# Patient Record
Sex: Male | Born: 1955 | Race: Black or African American | Hispanic: No | Marital: Married | Smoking: Never smoker
Health system: Southern US, Community
[De-identification: ages and names within clinical notes are randomized; demographics above are authoritative.]

## PROBLEM LIST (undated history)

## (undated) DIAGNOSIS — E119 Type 2 diabetes mellitus without complications: Secondary | ICD-10-CM

## (undated) DIAGNOSIS — N189 Chronic kidney disease, unspecified: Secondary | ICD-10-CM

## (undated) DIAGNOSIS — I10 Essential (primary) hypertension: Secondary | ICD-10-CM

## (undated) HISTORY — PX: REPLACEMENT TOTAL HIP W/  RESURFACING IMPLANTS: SUR1222

## (undated) HISTORY — PX: OSTEOTOMY TOES: SUR996

---

## 2015-02-08 ENCOUNTER — Telehealth: Payer: Self-pay | Admitting: *Deleted

## 2015-02-08 ENCOUNTER — Ambulatory Visit (INDEPENDENT_AMBULATORY_CARE_PROVIDER_SITE_OTHER): Payer: BLUE CROSS/BLUE SHIELD

## 2015-02-08 VITALS — BP 133/94 | HR 83 | Resp 16

## 2015-02-08 DIAGNOSIS — M158 Other polyosteoarthritis: Secondary | ICD-10-CM | POA: Diagnosis not present

## 2015-02-08 DIAGNOSIS — Q828 Other specified congenital malformations of skin: Secondary | ICD-10-CM

## 2015-02-08 DIAGNOSIS — M79673 Pain in unspecified foot: Secondary | ICD-10-CM

## 2015-02-08 DIAGNOSIS — M2012 Hallux valgus (acquired), left foot: Secondary | ICD-10-CM

## 2015-02-08 DIAGNOSIS — M7750 Other enthesopathy of unspecified foot: Secondary | ICD-10-CM | POA: Diagnosis not present

## 2015-02-08 DIAGNOSIS — M21612 Bunion of left foot: Secondary | ICD-10-CM

## 2015-02-08 DIAGNOSIS — M779 Enthesopathy, unspecified: Secondary | ICD-10-CM

## 2015-02-08 DIAGNOSIS — M2010 Hallux valgus (acquired), unspecified foot: Secondary | ICD-10-CM

## 2015-02-08 DIAGNOSIS — M2011 Hallux valgus (acquired), right foot: Secondary | ICD-10-CM

## 2015-02-08 DIAGNOSIS — M21611 Bunion of right foot: Secondary | ICD-10-CM

## 2015-02-08 DIAGNOSIS — M778 Other enthesopathies, not elsewhere classified: Secondary | ICD-10-CM

## 2015-02-08 NOTE — Patient Instructions (Addendum)
Pre-Operative Instructions  Congratulations, you have decided to take an important step to improving your quality of life.  You can be assured that the doctors of [facility] will be with you every step of the way.  1. Plan to be at the surgery center/hospital at least 1 (one) hour prior to your scheduled time unless otherwise directed by the surgical center/hospital staff.  You must have a responsible adult accompany you, remain during the surgery and drive you home.  Make sure you have directions to the surgical center/hospital and know how to get there on time. 2. For hospital based surgery you will need to obtain a history and physical form from your family physician within 1 month prior to the date of surgery- we will give you a form for you primary physician.  3. We make every effort to accommodate the date you request for surgery.  There are however, times where surgery dates or times have to be moved.  We will contact you as soon as possible if a change in schedule is required.   4. No Aspirin/Ibuprofen for one week before surgery.  If you are on aspirin, any non-steroidal anti-inflammatory medications (Mobic, Aleve, Ibuprofen) you should stop taking it 7 days prior to your surgery.  You make take Tylenol  For pain prior to surgery.  5. Medications- If you are taking daily heart and blood pressure medications, seizure, reflux, allergy, asthma, anxiety, pain or diabetes medications, make sure the surgery center/hospital is aware before the day of surgery so they may notify you which medications to take or avoid the day of surgery. 6. No food or drink after midnight the night before surgery unless directed otherwise by surgical center/hospital staff. 7. No alcoholic beverages 24 hours prior to surgery.  No smoking 24 hours prior to or 24 hours after surgery. 8. Wear loose pants or shorts- loose enough to fit over bandages, boots, and casts. 9. No slip on shoes, sneakers are best. 10. Bring  your boot with you to the surgery center/hospital.  Also bring crutches or a walker if your physician has prescribed it for you.  If you do not have this equipment, it will be provided for you after surgery. 11. If you have not been contracted by the surgery center/hospital by the day before your surgery, call to confirm the date and time of your surgery. 12. Leave-time from work may vary depending on the type of surgery you have.  Appropriate arrangements should be made prior to surgery with your employer. 13. Prescriptions will be provided immediately following surgery by your doctor.  Have these filled as soon as possible after surgery and take the medication as directed. 14. Remove nail polish on the operative foot. 15. Wash the night before surgery.  The night before surgery wash the foot and leg well with the antibacterial soap provided and water paying special attention to beneath the toenails and in between the toes.  Rinse thoroughly with water and dry well with a towel.  Perform this wash unless told not to do so by your physician.  Enclosed: 1 Ice pack (please put in freezer the night before surgery)   1 Hibiclens skin cleaner   Pre-op Instructions  If you have any questions regarding the instructions, do not hesitate to call our office.  [place]: [address] [place], [place] [address] [phone]  [place]: [address] , [place] [address] [phone]  [place]: [address]  [place], [place] [address] [phone]  Dr. Xylia Scherger   Tuchman DPM, Dr. Norman Regal DPM Dr. Osten Janek DPM, Dr. M. Todd Hyatt DPM, Dr. Kathryn Egerton DPMPre-Operative Instructions  Congratulations, you have decided to take an important step to improving your quality of life.  You can be assured that the doctors of [facility] will be with you every step of the way.  16. Plan to be at the surgery center/hospital at least 1 (one) hour prior to your scheduled time unless otherwise directed  by the surgical center/hospital staff.  You must have a responsible adult accompany you, remain during the surgery and drive you home.  Make sure you have directions to the surgical center/hospital and know how to get there on time. 17. For hospital based surgery you will need to obtain a history and physical form from your family physician within 1 month prior to the date of surgery- we will give you a form for you primary physician.  18. We make every effort to accommodate the date you request for surgery.  There are however, times where surgery dates or times have to be moved.  We will contact you as soon as possible if a change in schedule is required.   19. No Aspirin/Ibuprofen for one week before surgery.  If you are on aspirin, any non-steroidal anti-inflammatory medications (Mobic, Aleve, Ibuprofen) you should stop taking it 7 days prior to your surgery.  You make take Tylenol  For pain prior to surgery.  20. Medications- If you are taking daily heart and blood pressure medications, seizure, reflux, allergy, asthma, anxiety, pain or diabetes medications, make sure the surgery center/hospital is aware before the day of surgery so they may notify you which medications to take or avoid the day of surgery. 21. No food or drink after midnight the night before surgery unless directed otherwise by surgical center/hospital staff. 22. No alcoholic beverages 24 hours prior to surgery.  No smoking 24 hours prior to or 24 hours after surgery. 23. Wear loose pants or shorts- loose enough to fit over bandages, boots, and casts. 24. No slip on shoes, sneakers are best. 25. Bring your boot with you to the surgery center/hospital.  Also bring crutches or a walker if your physician has prescribed it for you.  If you do not have this equipment, it will be provided for you after surgery. 26. If you have not been contracted by the surgery center/hospital by the day before your surgery, call to confirm the date and time  of your surgery. 27. Leave-time from work may vary depending on the type of surgery you have.  Appropriate arrangements should be made prior to surgery with your employer. 28. Prescriptions will be provided immediately following surgery by your doctor.  Have these filled as soon as possible after surgery and take the medication as directed. 29. Remove nail polish on the operative foot. 30. Wash the night before surgery.  The night before surgery wash the foot and leg well with the antibacterial soap provided and water paying special attention to beneath the toenails and in between the toes.  Rinse thoroughly with water and dry well with a towel.  Perform this wash unless told not to do so by your physician.  Enclosed: 1 Ice pack (please put in freezer the night before surgery)   1 Hibiclens skin cleaner   Pre-op Instructions  If you have any questions regarding the instructions, do not hesitate to call our office.  [place]: [address] [place], [place] [address] [phone]  [place]: [address] [place], [place] [address] [phone]  [place]: [address]  [place], [place] [address] [phone]  Dr. Silveria Botz Tuchman DPM, Dr. Norman Regal DPM Dr. Jamile Sivils DPM, Dr. M. Todd Hyatt DPM, Dr. Kathryn Egerton DPM 

## 2015-02-08 NOTE — Progress Notes (Signed)
   Subjective:    Patient ID: Michael Burnett, male    DOB: January 20, 1956, 59 y.o.   MRN: [id number]  HPI Pt presents with bilateral bunion deformities and painful bilateral calluses   Review of Systems  Respiratory: Positive for cough.   Gastrointestinal: Positive for abdominal distention.  Musculoskeletal: Positive for back pain, arthralgias and gait problem.  Neurological: Positive for dizziness and headaches.       Objective:   Physical Exam 59 year old of current male presents as referral from Dr. Elijah Birkom. Complaining of painful bunions and keratoses sub-third MTP area bilateral left foot more so than right. Has some stiffness in the great toe joint has been treated in the past for gouty arthropathy. Growing taking his and his indomethacin on an as-needed basis. Objective findings revealed pedal pulses palpable DP and PT +2 over 4 bilateral capillary refill time 3 seconds all digits of epicritic and proprioceptive sensations intact and symmetric bilateral there is normal plantar response and DTRs levels and watching skin color pigment normal hair growth absent there is keratoses sub-third MTP area bilateral there is flexible digital contractures lesser digits 234 and 5 with adductovarus fifth digit, there is some elevation of the third and fourth digits at the MTP joint with some dorsal capsular contracture at the third MTP joint in particular bilateral. There is limited range of motion hallux rigidus great toe joint with asymmetric joint space narrowing noted clinically and radiographically at the first MTP joint within his around 30 there 25-30 dorsiflexion 510 plantarflexion bilateral left more painful symptomatically right. Position 3 there is a very.IM angle greater than 12 hallux abductus greater than 20       Assessment & Plan:  Assessment this time is #1 hallux abductovalgus with bunion deformity and hallux rigidus first MTP joint bilateral capsulitis is also capsulitis with  plantarflexed third metatarsals bilateral. With assisted capsulitis of the MTP joint and oral keratoses sub-3 bilateral plan at this time discussed options patient had tried changing shoes pads cushioning anti-inflammatory medications TEMPORARY relief at this time after referral from Dr. Zachary Georgeomaszewski patient is ready for surgical intervention at this time [facility] with cheilectomy and decompression first MTP area left foot is reviewed also a decompression or shortening osteotomy third metatarsal left with capsulotomy of the MTP joint is also reviewed the consent forms were reviewed and signed risk of case alternatives were reviewed all questions by the patient answered this time surgery scheduled at his convenience with appropriate follow-up thereafter will be in an air fracture boot for at least a 5-6 week. At 3 months will be related to contralateral foot per patient request. At this time consent form reviewed and signed and surgery scheduled per patient request  Alvan Dameichard Spero Gunnels DPM

## 2015-02-08 NOTE — Telephone Encounter (Signed)
I called patient to see if we could reschedule his surgery to 02/22/2015 from 02/15/2015.  "I'd rather not, I have a ride for 02/15/2015.  It'll be hard to get it done the following week."  Okay, we'll leave it for 02/15/2015.

## 2015-02-15 DIAGNOSIS — M21542 Acquired clubfoot, left foot: Secondary | ICD-10-CM | POA: Diagnosis not present

## 2015-02-15 DIAGNOSIS — M2012 Hallux valgus (acquired), left foot: Secondary | ICD-10-CM | POA: Diagnosis not present

## 2015-02-21 ENCOUNTER — Ambulatory Visit (INDEPENDENT_AMBULATORY_CARE_PROVIDER_SITE_OTHER): Payer: BLUE CROSS/BLUE SHIELD

## 2015-02-21 VITALS — BP 133/97 | HR 69 | Resp 12

## 2015-02-21 DIAGNOSIS — M2010 Hallux valgus (acquired), unspecified foot: Secondary | ICD-10-CM

## 2015-02-21 DIAGNOSIS — Z09 Encounter for follow-up examination after completed treatment for conditions other than malignant neoplasm: Secondary | ICD-10-CM

## 2015-02-21 DIAGNOSIS — M2012 Hallux valgus (acquired), left foot: Secondary | ICD-10-CM | POA: Diagnosis not present

## 2015-02-21 DIAGNOSIS — M79673 Pain in unspecified foot: Secondary | ICD-10-CM

## 2015-02-21 DIAGNOSIS — M1A9XX1 Chronic gout, unspecified, with tophus (tophi): Secondary | ICD-10-CM

## 2015-02-21 DIAGNOSIS — M158 Other polyosteoarthritis: Secondary | ICD-10-CM

## 2015-02-21 DIAGNOSIS — M109 Gout, unspecified: Secondary | ICD-10-CM

## 2015-02-21 NOTE — Progress Notes (Signed)
   Subjective:    Patient ID: Michael Burnett, male    DOB: 04-29-1956, 59 y.o.   MRN: [id number]  HPI patient doesn't history of arthritic left great toe joint surgery confirmed large tophaceous deposits from the MTP joint which were removed during surgery.  dos 02-15-2015 bunionectomy.  ''LT FOOT IS DOING OK.''  Review of Systems no new findings or systemic changes are noted    Objective:   Physical Exam  neurovascular status is intact pedal pulses are palpable epicritic and proprioceptive sensations intact and symmetric patient did have severe gouty arthropathy with multiple tophi that were expressed pathology did confirm that. X-rays this time reveal good position osteotomy intact K wire fixation the first and screw fixation of third metatarsal of the plantar flexed third metatarsals resolving well patient had minimal pain or discomfort. No dehiscence no discharge or drainage patient has approximately 30 dorsiflexion to 35 510 plantar flexion again there is some it was severely some limitation of motion of the hallux and large tophi identified within surgery.        Assessment & Plan:   assessment good postop progress is 6 days status post Austin bunionectomy with cheilectomy decompression first MTP area left as well as third met osteotomy. Patient is ambulating comfortably presto compressive dressing reapplied at this time patient is advised that active and passive range of motion exercises recheck in one week for further follow-up maintain air fracture boot at all times and do a least a Hunter toe pushups as instructed daily  Harriet Masson DPM

## 2015-02-28 ENCOUNTER — Ambulatory Visit (INDEPENDENT_AMBULATORY_CARE_PROVIDER_SITE_OTHER): Payer: BLUE CROSS/BLUE SHIELD | Admitting: Podiatry

## 2015-02-28 ENCOUNTER — Encounter: Payer: Self-pay | Admitting: Podiatry

## 2015-02-28 VITALS — BP 121/80 | HR 66 | Resp 12

## 2015-02-28 DIAGNOSIS — M2012 Hallux valgus (acquired), left foot: Secondary | ICD-10-CM | POA: Diagnosis not present

## 2015-02-28 DIAGNOSIS — Z09 Encounter for follow-up examination after completed treatment for conditions other than malignant neoplasm: Secondary | ICD-10-CM

## 2015-02-28 DIAGNOSIS — M21612 Bunion of left foot: Secondary | ICD-10-CM

## 2015-03-01 NOTE — Progress Notes (Signed)
Subjective:     Patient ID: Michael Burnett, male   DOB: [birth year], 59 y.o.   MRN: [id number]  HPI patient presents stating my left foot is doing well but I do get some pain when I try to walk or try to be active on it   Review of Systems     Objective:   Physical Exam Neurovascular status intact muscle strength was adequate with range of motion that is adequate of the first MPJ with approximate 20 dorsiflexion 15 of plantarflexion with no crepitus currently noted. Wound edges are found to be well coapted and I'm and is noted    Assessment:     Recovering well from hallux limitus procedure left and neuroma excision left with slight restriction of motion noted    Plan:     Reviewed condition and at this time dispensed a BioSkin to plantarflexed the left hallux and a nonweightbearing position to stretch the dorsal capsule. Gave instructions on range of motion exercises and also dispensed surgical shoe with instructions on usage. Reappoint to recheck

## 2015-03-13 ENCOUNTER — Ambulatory Visit (INDEPENDENT_AMBULATORY_CARE_PROVIDER_SITE_OTHER): Payer: BLUE CROSS/BLUE SHIELD

## 2015-03-13 ENCOUNTER — Ambulatory Visit (INDEPENDENT_AMBULATORY_CARE_PROVIDER_SITE_OTHER): Payer: BLUE CROSS/BLUE SHIELD | Admitting: Podiatry

## 2015-03-13 ENCOUNTER — Encounter: Payer: Self-pay | Admitting: Podiatry

## 2015-03-13 VITALS — BP 145/98 | HR 75 | Resp 18

## 2015-03-13 DIAGNOSIS — Z09 Encounter for follow-up examination after completed treatment for conditions other than malignant neoplasm: Secondary | ICD-10-CM

## 2015-03-13 DIAGNOSIS — M2012 Hallux valgus (acquired), left foot: Secondary | ICD-10-CM

## 2015-03-13 NOTE — Progress Notes (Signed)
Patient ID: Michael Burnett, male   DOB: [birth year], 59 y.o.   MRN: [id number]  Subjective: 59 year old male presents the office they status post left foot Austin bunionectomy and third metatarsal osteotomy. She does that overall he is doing well. Continued with wearing surgical shoe. He states his pain is controlled. Denies any systemic complaints as fevers, chills, nausea, vomiting. Denies any calf pain, chest pain, shortness of breath.  Objective: AAO 3, NAD DP/PT pulses palpable, CRT less than 3 seconds Protective sensation appears to be intact with Dorann OuSimms Weinstein monofilament Incisions are well coapted without any evidence of dehiscence and there is no swelling erythema, increase in warmth, drainage, or any other clinical signs of infection. There are some suture knots visible at the end of the incisions. There is mild overlying edema to the left forefoot however subjective the patient states that it has decreased. There is no associated erythema or increase in warmth. There is no specific tenderness along the site of the surgery. There is no pain with first MTPJ range of motion.No other areas of tenderness to bilateral lower extremities. No open lesions or pre-ulcer lesions identified. There is no pain with calf compression, swelling, warmth, erythema.  Assessment: 59 year old male 4 weeks status post left foot surgery, doing well  Plan: -X-rays were obtained and reviewed with the patient. -Treatment options were discussed including alternatives, risks, complications. -suture knots were removed without complications. -Continue surgical shoe. weightbearing as tolerated -Ice and elevation -pain meds as needed -Follow-up in 2 weeks for repeat x-rays. At that time will likely transition to a regular shoe. In the meantime occurs call the office with any questions, concerns, change in symptoms.

## 2015-03-29 ENCOUNTER — Ambulatory Visit (INDEPENDENT_AMBULATORY_CARE_PROVIDER_SITE_OTHER): Payer: BLUE CROSS/BLUE SHIELD | Admitting: Podiatry

## 2015-03-29 ENCOUNTER — Ambulatory Visit (INDEPENDENT_AMBULATORY_CARE_PROVIDER_SITE_OTHER): Payer: BLUE CROSS/BLUE SHIELD

## 2015-03-29 ENCOUNTER — Encounter: Payer: Self-pay | Admitting: Podiatry

## 2015-03-29 VITALS — BP 137/100 | HR 87 | Resp 18

## 2015-03-29 DIAGNOSIS — Z09 Encounter for follow-up examination after completed treatment for conditions other than malignant neoplasm: Secondary | ICD-10-CM

## 2015-03-29 DIAGNOSIS — M21612 Bunion of left foot: Secondary | ICD-10-CM

## 2015-03-29 DIAGNOSIS — M2012 Hallux valgus (acquired), left foot: Secondary | ICD-10-CM

## 2015-03-29 MED ORDER — HYDROCODONE-ACETAMINOPHEN 5-325 MG PO TABS: 1.0000 | ORAL_TABLET | Freq: Four times a day (QID) | ORAL | Status: DC | PRN

## 2015-03-31 ENCOUNTER — Encounter: Payer: Self-pay | Admitting: Podiatry

## 2015-03-31 NOTE — Progress Notes (Signed)
Patient ID: Michael Burnett, male   DOB: [birth year], 59 y.o.   MRN: [id number]  Subjective: Mr. Michael Burnett presents the office today status post left foot Austin bunionectomy and third metatarsal osteotomy. She does that overall he is doing well and has continued with wearing surgical shoe. He states his pain is controlled. He has run out of the vicodin and would like another prescription just in case of increased pain. Denies any systemic complaints as fevers, chills, nausea, vomiting. Denies any calf pain, chest pain, shortness of breath.  Objective: AAO 3, NAD DP/PT pulses palpable, CRT less than 3 seconds Protective sensation appears to be intact with Dorann OuSimms Weinstein monofilament Incisions are well coapted without any evidence of dehiscence and there is no swelling erythema, increase in warmth, drainage, or any other clinical signs of infection. There is decreased edema to the left forefoot. There is no associated erythema or increase in warmth. There is no specific tenderness along the site of the surgery. There is no pain with first MTPJ range of motion.No other areas of tenderness to bilateral lower extremities. No open lesions or pre-ulcer lesions identified. There is no pain with calf compression, swelling, warmth, erythema.  Assessment: 59 year old male 6 weeks status post left foot surgery, doing well  Plan: -X-rays were obtained and reviewed with the patient. Osteotomy site is present on the 1st metatarsal and on the 3rd metatarsal there is bone callus formation proximal to the osteotomy.  -Treatment options were discussed including alternatives, risks, complications. -Continue surgical shoe. Weightbearing as tolerated -Ice and elevation -Pain meds as needed. Refilled vicodin.  -Follow-up in 2 weeks for repeat x-rays.  In the meantime occurs call the office with any questions, concerns, change in symptoms.

## 2015-04-12 ENCOUNTER — Ambulatory Visit (INDEPENDENT_AMBULATORY_CARE_PROVIDER_SITE_OTHER): Payer: BLUE CROSS/BLUE SHIELD

## 2015-04-12 ENCOUNTER — Encounter: Payer: Self-pay | Admitting: Podiatry

## 2015-04-12 ENCOUNTER — Ambulatory Visit (INDEPENDENT_AMBULATORY_CARE_PROVIDER_SITE_OTHER): Payer: BLUE CROSS/BLUE SHIELD | Admitting: Podiatry

## 2015-04-12 VITALS — BP 153/97 | HR 74 | Resp 18

## 2015-04-12 DIAGNOSIS — M21612 Bunion of left foot: Secondary | ICD-10-CM

## 2015-04-12 DIAGNOSIS — M2012 Hallux valgus (acquired), left foot: Secondary | ICD-10-CM

## 2015-04-12 DIAGNOSIS — Z09 Encounter for follow-up examination after completed treatment for conditions other than malignant neoplasm: Secondary | ICD-10-CM

## 2015-04-16 ENCOUNTER — Encounter: Payer: Self-pay | Admitting: Podiatry

## 2015-04-16 NOTE — Progress Notes (Signed)
Patient ID: Michael Burnett, male   DOB: Apr 03, 1956, 59 y.o.   MRN: [id number]  Subjective: Michael Burnett presents the office today status post left foot Austin bunionectomy and third metatarsal osteotomy. He does that overall he is doing well and has continued with wearing surgical shoe. He doesn't he has some soreness to the surgical site as well as some swelling.  Denies any systemic complaints as fevers, chills, nausea, vomiting. Denies any calf pain, chest pain, shortness of breath.  Objective: AAO 3, NAD; presents wearing surgical shoe. DP/PT pulses palpable, CRT less than 3 seconds Protective sensation appears to be intact with Dorann OuSimms Weinstein monofilament Incisions are well coapted without any evidence of dehiscence and there is no swelling erythema, increase in warmth, drainage, or any other clinical signs of infection. There is mild edema to the left forefoot over on the side of the bunion surgery. There is no associated erythema or increase in warmth. There is mild discomfort along the first metatarsal distally along the surgical site. There is no tenderness along the third metatarsal. There is no pain with first MTPJ range of motion. No other areas of tenderness to bilateral lower extremities. No open lesions or pre-ulcer lesions identified. There is no pain with calf compression, swelling, warmth, erythema.  Assessment: 59 year old male 8 weeks status post left foot surgery with Dr. Ralene CorkSikora   Plan: -X-rays were obtained and reviewed with the patient. Osteotomy site is present on the 1st metatarsal and on the 3rd metatarsal there is bone callus formation proximal to the osteotomy.  -Treatment options were discussed including alternatives, risks, complications. -Ice and elevation -Due to the continued pain and swelling as well as evidence of callus formation on third metatarsal and there is still a radiolucent line evident in the first metatarsal I recommend continuing to wear the surgical shoe.  He can be weightbearing as tolerated. -Follow-up in 2 weeks for repeat x-rays.  In the meantime occurs call the office with any questions, concerns, change in symptoms.

## 2015-04-26 ENCOUNTER — Ambulatory Visit (INDEPENDENT_AMBULATORY_CARE_PROVIDER_SITE_OTHER): Payer: BLUE CROSS/BLUE SHIELD

## 2015-04-26 ENCOUNTER — Encounter: Payer: Self-pay | Admitting: Podiatry

## 2015-04-26 ENCOUNTER — Ambulatory Visit (INDEPENDENT_AMBULATORY_CARE_PROVIDER_SITE_OTHER): Payer: BLUE CROSS/BLUE SHIELD | Admitting: Podiatry

## 2015-04-26 VITALS — BP 137/90 | HR 80 | Resp 18

## 2015-04-26 DIAGNOSIS — M2012 Hallux valgus (acquired), left foot: Secondary | ICD-10-CM

## 2015-04-26 DIAGNOSIS — M21612 Bunion of left foot: Secondary | ICD-10-CM

## 2015-04-26 DIAGNOSIS — Z09 Encounter for follow-up examination after completed treatment for conditions other than malignant neoplasm: Secondary | ICD-10-CM

## 2015-04-30 ENCOUNTER — Encounter: Payer: Self-pay | Admitting: Podiatry

## 2015-04-30 NOTE — Progress Notes (Signed)
Patient ID: Michael Burnett, male   DOB: 07-09-1956, 59 y.o.   MRN: [id number]  Subjective: Michael Burnett presents the office today status post left foot Austin bunionectomy and third metatarsal osteotomy with Dr. Ralene CorkSikora. He does that overall he is doing well and has continued with wearing surgical shoe. He denies any pain, and overall states that he is doing good. Also he believes the swelling has decreased.  Denies any systemic complaints as fevers, chills, nausea, vomiting. Denies any calf pain, chest pain, shortness of breath.  Objective: AAO 3, NAD; presents wearing surgical shoe. DP/PT pulses palpable, CRT less than 3 seconds Protective sensation appears to be intact with Michael Burnett monofilament Incisions are well coapted without any evidence of dehiscence and a scar has formed. There is no surrounding erythema, increase in warmth, drainage, or any other clinical signs of infection. There is decreased edema to the left forefoot over on the side of the bunion surgery. There is no associated erythema or increase in warmth. There is no discomfort along the surgical sites. There is no pain with first MTPJ range of motion. No other areas of tenderness to bilateral lower extremities. No open lesions or pre-ulcer lesions identified. There is no pain with calf compression, swelling, warmth, erythema.  Assessment: 59 year old male 10 weeks status post left foot surgery with Dr. Ralene CorkSikora   Plan: -X-rays were obtained and reviewed with the patient.  -Treatment options were discussed including alternatives, risks, complications. -At this time he can return to regular, supportive, loosefitting sneaker as tolerated. If there is any increase in pain while transitioning to return to the surgical shoe and call the office immediately. -Ice and elevation -Follow-up in 4 weeks. At that time repeat x-rays will be done  In the meantime occurs call the office with any questions, concerns, change in symptoms. If  symptoms and improved the left foot healed like to schedule surgery on the right foot at that time as well.

## 2015-05-29 ENCOUNTER — Encounter: Payer: BLUE CROSS/BLUE SHIELD | Admitting: Podiatry

## 2015-06-19 ENCOUNTER — Ambulatory Visit: Payer: Self-pay

## 2015-06-19 ENCOUNTER — Ambulatory Visit (INDEPENDENT_AMBULATORY_CARE_PROVIDER_SITE_OTHER): Payer: BLUE CROSS/BLUE SHIELD | Admitting: Podiatry

## 2015-06-19 ENCOUNTER — Encounter: Payer: Self-pay | Admitting: Podiatry

## 2015-06-19 VITALS — BP 123/84 | HR 86 | Resp 15

## 2015-06-19 DIAGNOSIS — Z9889 Other specified postprocedural states: Secondary | ICD-10-CM

## 2015-06-19 DIAGNOSIS — M2012 Hallux valgus (acquired), left foot: Secondary | ICD-10-CM

## 2015-06-19 DIAGNOSIS — M216X1 Other acquired deformities of right foot: Secondary | ICD-10-CM

## 2015-06-19 DIAGNOSIS — M2011 Hallux valgus (acquired), right foot: Secondary | ICD-10-CM

## 2015-06-19 NOTE — Patient Instructions (Signed)
Pre-Operative Instructions  Congratulations, you have decided to take an important step to improving your quality of life.  You can be assured that the doctors of [facility] will be with you every step of the way.  1. Plan to be at the surgery center/hospital at least 1 (one) hour prior to your scheduled time unless otherwise directed by the surgical center/hospital staff.  You must have a responsible adult accompany you, remain during the surgery and drive you home.  Make sure you have directions to the surgical center/hospital and know how to get there on time. 2. For hospital based surgery you will need to obtain a history and physical form from your family physician within 1 month prior to the date of surgery- we will give you a form for you primary physician.  3. We make every effort to accommodate the date you request for surgery.  There are however, times where surgery dates or times have to be moved.  We will contact you as soon as possible if a change in schedule is required.   4. No Aspirin/Ibuprofen for one week before surgery.  If you are on aspirin, any non-steroidal anti-inflammatory medications (Mobic, Aleve, Ibuprofen) you should stop taking it 7 days prior to your surgery.  You make take Tylenol  For pain prior to surgery.  5. Medications- If you are taking daily heart and blood pressure medications, seizure, reflux, allergy, asthma, anxiety, pain or diabetes medications, make sure the surgery center/hospital is aware before the day of surgery so they may notify you which medications to take or avoid the day of surgery. 6. No food or drink after midnight the night before surgery unless directed otherwise by surgical center/hospital staff. 7. No alcoholic beverages 24 hours prior to surgery.  No smoking 24 hours prior to or 24 hours after surgery. 8. Wear loose pants or shorts- loose enough to fit over bandages, boots, and casts. 9. No slip on shoes, sneakers are best. 10. Bring  your boot with you to the surgery center/hospital.  Also bring crutches or a walker if your physician has prescribed it for you.  If you do not have this equipment, it will be provided for you after surgery. 11. If you have not been contracted by the surgery center/hospital by the day before your surgery, call to confirm the date and time of your surgery. 12. Leave-time from work may vary depending on the type of surgery you have.  Appropriate arrangements should be made prior to surgery with your employer. 13. Prescriptions will be provided immediately following surgery by your doctor.  Have these filled as soon as possible after surgery and take the medication as directed. 14. Remove nail polish on the operative foot. 15. Wash the night before surgery.  The night before surgery wash the foot and leg well with the antibacterial soap provided and water paying special attention to beneath the toenails and in between the toes.  Rinse thoroughly with water and dry well with a towel.  Perform this wash unless told not to do so by your physician.  Enclosed: 1 Ice pack (please put in freezer the night before surgery)   1 Hibiclens skin cleaner   Pre-op Instructions  If you have any questions regarding the instructions, do not hesitate to call our office.  [place]: [address] [place], [place] [address] [phone]  [place]: [address] [place], [place] [address] [phone]  [place]: [address]  [place], [place] [address] [phone]  Dr. Richard   Tuchman DPM, Dr. Norman Regal DPM Dr. Richard Sikora DPM, Dr. M. Todd Hyatt DPM, Dr. Kathryn Egerton DPM, Dr. Nyaisha Simao, DPM 

## 2015-06-20 NOTE — Progress Notes (Signed)
Patient ID: Michael Burnett, male   DOB: [birth year], 59 y.o.   MRN: [id number]  Subjective: 59 year old male presents the office a follow-up evaluation of left foot surgery after undergoing bunionectomy and third metatarsal osteotomy. He states that since last time he is doing well he is no pain along the surgical sites. He is able to and in a regular shoe without problems. This time he would schedule surgery for the right side. He states he continues have pain overlying a prominent bunion deformity right foot as well as having a painful callus submetatarsal 3. He is attended conservative treatment including but not limited to shoe gear modifications, padding, offloading, periodic debridements any resolution of symptoms. At this time is requesting surgical intervention on the right side to help decrease his pain and deformity. No other complaints at this time in no acute changes since last appointment.  Objective: AAO 3, NAD DP/PT pulses palpable bilaterally, CRT less than 3 seconds Protective sensation intact with Simms Weinstein monofilament Incision the left foot of both well coapted the scar over that area. There is no tenderness to palpation overlying the surgical sites. There is no overlying edema, erythema, increase in warmth. On the right foot there is a prominent HAV deformity present. There is slight irritation of the medial eminence the first metatarsal head from irritation shoe gear. There is no pain or crepitation with first MTPJ range of motion. There is a prominent hyperkeratotic lesion submetatarsal 3 with prominent metatarsal head plantarly. There is atrophy of the fat pad. There is tenderness palpation overlying both bunion and third metatarsal site. No other areas of tenderness to bilateral lower chemise. No overlying edema, erythema, increase in warmth. No open lesions or pre-ulcer lesions are divided elsewhere. No pain with calf compression, swelling, warmth,  erythema.  Assessment: 59 year old male with healing left foot surgical site with right foot moderate HAV, submetatarsal 3 hyperkeratotic lesion  Plan" -X-rays were obtained and reviewed with the patient.  -Treatment options discussed including all alternatives, risks, and complications -At this time continue regular shoe gear and the left side. Ice and elevation as needed. Depression anklet as needed. -Discussed surgical intervention on the right side. Discussed the possibility at 3 with screw/Y fixation and third metatarsal osteotomy with screw fixation. At this time elected to proceed with surgical intervention as she is attended conservative treatment without any resolution of symptoms. The incision placement as well as the postoperative course was discussed with the patient. I discussed risks of the surgery which include, but not limited to, infection, bleeding, pain, swelling, need for further surgery, delayed or nonhealing, painful or ugly scar, numbness or sensation changes, over/under correction, recurrence, transfer lesions, further deformity, hardware failure, DVT/PE, loss of toe/foot. Patient understands these risks and wishes to proceed with surgery. The surgical consent was reviewed with the patient all 3 pages were signed. No promises or guarantees were given to the outcome of the procedure. All questions were answered to the best of my ability. Before the surgery the patient was encouraged to call the office if there is any further questions. [facility] basis.  Ovid CurdMatthew Ledarius Leeson, DPM

## 2015-07-04 ENCOUNTER — Telehealth: Payer: Self-pay | Admitting: *Deleted

## 2015-07-04 NOTE — Telephone Encounter (Signed)
"  I'm a patient there.  I was scheduled for surgery.  When was I scheduled?  I haven't heard anything from anyone about the time."  You're scheduled for surgery on 07/19/2015.  The surgical center will call you a day or two prior to and let you know the arrival time.  "Okay thank you."

## 2015-07-18 ENCOUNTER — Telehealth: Payer: Self-pay | Admitting: *Deleted

## 2015-07-18 NOTE — Telephone Encounter (Addendum)
"  Michael Burnett is scheduled for surgery tomorrow.  We have not been able to reach him.  We can't leave a message or anything.  Thank you."   I'm calling you regarding surgery.  The surgical center said they haven't been able to reach you.  "I just spoke to them this morning.  They told me what time I needed to be there tomorrow.  What time did they call you?"  It was around 11am.  "Okay, I called them around that time.  Thanks for calling."

## 2015-07-19 ENCOUNTER — Encounter: Payer: Self-pay | Admitting: Podiatry

## 2015-07-19 DIAGNOSIS — M2011 Hallux valgus (acquired), right foot: Secondary | ICD-10-CM | POA: Diagnosis not present

## 2015-07-19 DIAGNOSIS — M21541 Acquired clubfoot, right foot: Secondary | ICD-10-CM | POA: Diagnosis not present

## 2015-07-21 ENCOUNTER — Telehealth: Payer: Self-pay | Admitting: *Deleted

## 2015-07-21 NOTE — Telephone Encounter (Signed)
Courtesy call, I reminded pt of appt 07/24/2015 at 130pm and instructed pt to remain in the surgical boot at all times, rest, ice and elevate and not to weight bear more than 5 min/hour and to call with concerns.

## 2015-07-24 ENCOUNTER — Ambulatory Visit (INDEPENDENT_AMBULATORY_CARE_PROVIDER_SITE_OTHER): Payer: BLUE CROSS/BLUE SHIELD

## 2015-07-24 ENCOUNTER — Ambulatory Visit (INDEPENDENT_AMBULATORY_CARE_PROVIDER_SITE_OTHER): Payer: BLUE CROSS/BLUE SHIELD | Admitting: Podiatry

## 2015-07-24 ENCOUNTER — Encounter: Payer: Self-pay | Admitting: Podiatry

## 2015-07-24 VITALS — BP 122/80 | HR 101 | Resp 18

## 2015-07-24 DIAGNOSIS — M2011 Hallux valgus (acquired), right foot: Secondary | ICD-10-CM | POA: Diagnosis not present

## 2015-07-24 DIAGNOSIS — Z9889 Other specified postprocedural states: Secondary | ICD-10-CM

## 2015-07-24 NOTE — Progress Notes (Signed)
Patient ID: Michael Burnett, male   DOB: 04/16/1956, 59 y.o.   MRN: [id number]  DOS: 07/19/15 /sp R Austin bunionectomy and 3rd metatarsal osteotomy  Subjective:  59 year old male presents the office today one-week status post right Austin bunionectomy, third metatarsal osteotomy. He states that overall he is doing well. His pain is controlled current pain medicines. He has continue with the Cam Walker. He hasn't taken antibiotics as directed. Denies any systemic complaints as fevers, chills, nausea, vomiting. Denies any calf pain, chest pain, concerns of breath. No other complaints at this time in no acute changes since last appointment.  Objective: AAO x3, NAD DP/PT pulses palpable bilaterally, CRT less than 3 seconds Protective sensation intact with Simms Weinstein monofilament  ncisional the dorsal medial aspect of the right foot as well as the third metatarsal distally is well coapted without any evidence of dehiscence. There is no surrounding erythema, ascending saline disc, fluctuance, crepitus, malodor, drainage/purulence. There is mild to palpation overlying the surgical site. No pain with 1st MTPJ ROM.  No pain to the contralateral lower extremity.  No other areas of tenderness to bilateral lower extremities. MMT 5/5, ROM WNL.  No open lesions or pre-ulcerative lesions.  No overlying edema, erythema, increase in warmth to bilateral lower extremities.  No pain with calf compression, swelling, warmth, erythema bilaterally.   Assessment: 59 year old male 5 days s/p R Austin bunionectomy and 3rd metatarsal osteotomy.   Plan: -Treatment options discussed including all alternatives, risks, and complications -X-rays were obtained and reviewed with the patient.  -Antibiotic ointment was applied over the incisions followed by a DSD. Keep clean, dry, intact.  -Continue CAM boot at all times, even at night. WBAT  -Ice and elevation -Pain medication as needed -Monitor for any clinical signs or  symptoms of infection and directed to call the office immediately should any occur or go to the ER. -Follow-up 1 week for suture removal or sooner if any problems arise. In the meantime, encouraged to call the office with any questions, concerns, change in symptoms.   Ovid Curd, DPM

## 2015-07-31 ENCOUNTER — Encounter: Payer: Self-pay | Admitting: Podiatry

## 2015-07-31 ENCOUNTER — Ambulatory Visit (INDEPENDENT_AMBULATORY_CARE_PROVIDER_SITE_OTHER): Payer: BLUE CROSS/BLUE SHIELD | Admitting: Podiatry

## 2015-07-31 VITALS — BP 148/95 | HR 89 | Resp 18

## 2015-07-31 DIAGNOSIS — Z9889 Other specified postprocedural states: Secondary | ICD-10-CM

## 2015-07-31 DIAGNOSIS — M2011 Hallux valgus (acquired), right foot: Secondary | ICD-10-CM

## 2015-07-31 MED ORDER — OXYCODONE-ACETAMINOPHEN 5-325 MG PO TABS: 1.0000 | ORAL_TABLET | Freq: Four times a day (QID) | ORAL | Status: DC | PRN

## 2015-07-31 NOTE — Progress Notes (Signed)
Patient ID: Michael Burnett, male   DOB: October 31, 1956, 59 y.o.   MRN: [id number]  DOS: 07/19/15 /sp R Austin bunionectomy and 3rd metatarsal osteotomy  Subjective: 59 year old male presents the office today 2-weeks status post right Austin bunionectomy, third metatarsal osteotomy. He states that overall he is doing well and his pain is better controlled. He states that it is "up and down". He has continue with the surgical shoe. Denies any systemic complaints as fevers, chills, nausea, vomiting. Denies any calf pain, chest pain, concerns of breath. No other complaints at this time in no acute changes since last appointment.  Objective: AAO x3, NAD DP/PT pulses palpable bilaterally, CRT less than 3 seconds Protective sensation intact with Simms Weinstein monofilament Incision on the dorsal medial aspect of the right foot as well as the third metatarsal distally is well coapted without any evidence of dehiscence. There is no surrounding erythema, ascending cellulitis , fluctuance, crepitus, malodor, drainage/purulence. There is mild to palpation overlying the surgical site. Mild overlying edema. No pain with 1st MTPJ ROM.  No pain to the contralateral lower extremity.  No other areas of tenderness to bilateral lower extremities. MMT 5/5, ROM WNL.  No open lesions or pre-ulcerative lesions.  No overlying edema, erythema, increase in warmth to bilateral lower extremities.  No pain with calf compression, swelling, warmth, erythema bilaterally.   Assessment: 59 year old male  s/p R Austin bunionectomy and 3rd metatarsal osteotomy.   Plan: -Treatment options discussed including all alternatives, risks, and complications -Suture were removed without complications. Antibiotic ointment was applied over the incisions followed by a DSD. Keep clean, dry, intact. CAn remove tomorrow and start to shower as long as incision remains well copated.  -Continue with surgical shoe.   -Ice and elevation -Pain medication  as needed; refilled today -ROM of 1st MTPJ -Monitor for any clinical signs or symptoms of infection and directed to call the office immediately should any occur or go to the ER. -Follow-up 2 week or sooner if any problems arise. In the meantime, encouraged to call the office with any questions, concerns, change in symptoms.   Ovid Curd, DPM

## 2015-08-14 ENCOUNTER — Encounter: Payer: Self-pay | Admitting: Podiatry

## 2015-08-14 ENCOUNTER — Ambulatory Visit (INDEPENDENT_AMBULATORY_CARE_PROVIDER_SITE_OTHER): Payer: BLUE CROSS/BLUE SHIELD

## 2015-08-14 ENCOUNTER — Ambulatory Visit (INDEPENDENT_AMBULATORY_CARE_PROVIDER_SITE_OTHER): Payer: BLUE CROSS/BLUE SHIELD | Admitting: Podiatry

## 2015-08-14 VITALS — BP 127/82 | HR 80 | Resp 18

## 2015-08-14 DIAGNOSIS — Z9889 Other specified postprocedural states: Secondary | ICD-10-CM

## 2015-08-14 DIAGNOSIS — M2011 Hallux valgus (acquired), right foot: Secondary | ICD-10-CM

## 2015-08-14 NOTE — Progress Notes (Signed)
Patient ID: Michael Burnett, male   DOB: 05/05/1956, 59 y.o.   MRN: [id number]  DOS: 07/19/15 /sp R Austin bunionectomy and 3rd metatarsal osteotomy  Subjective: 59 year old male presents the office today 4-weeks status post right Austin bunionectomy, third metatarsal osteotomy. He states that overall he is doing well and his pain is controlled and no longer taking any pain medication. He has continue with the surgical shoe. Denies any systemic complaints as fevers, chills, nausea, vomiting. Denies any calf pain, chest pain, concerns of breath. No other complaints at this time in no acute changes since last appointment.  Objective: AAO x3, NAD DP/PT pulses palpable bilaterally, CRT less than 3 seconds Protective sensation intact with Simms Weinstein monofilament Incision on the dorsal medial aspect of the right foot as well as the third metatarsal distally is well coapted without any evidence of dehiscence and a scar has formed. There is no surrounding erythema, ascending cellulitis , fluctuance, crepitus, malodor, drainage/purulence. There is no pain to palpation overlying the surgical site. Trace overlying edema. No pain with 1st or 3rd MTPJ ROM.  No pain to the contralateral lower extremity.  No other areas of tenderness to bilateral lower extremities. MMT 5/5, ROM WNL.  No open lesions or pre-ulcerative lesions.  No overlying edema, erythema, increase in warmth to bilateral lower extremities.  No pain with calf compression, swelling, warmth, erythema bilaterally.   Assessment: 59 year old male  s/p R Austin bunionectomy and 3rd metatarsal osteotomy.   Plan: -Treatment options discussed including all alternatives, risks, and complications -Can continue with the butterfly oblique femur of the scar daily.  -Continue with surgical shoe.   -Ice and elevation -Pain medication as needed -ROM of 1st MTPJ; again demonstrated how to do this. -Monitor for any clinical signs or symptoms of infection  and directed to call the office immediately should any occur or go to the ER. -Follow-up 2 week or sooner if any problems arise. In the meantime, encouraged to call the office with any questions, concerns, change in symptoms. We'll likely transition back into regular shoe next appointment pending x-rays.  Ovid Curd, DPM

## 2015-08-22 ENCOUNTER — Encounter: Payer: Self-pay | Admitting: Podiatry

## 2015-08-22 ENCOUNTER — Telehealth: Payer: Self-pay | Admitting: *Deleted

## 2015-08-22 MED ORDER — OXYCODONE-ACETAMINOPHEN 5-325 MG PO TABS: 1.0000 | ORAL_TABLET | Freq: Four times a day (QID) | ORAL | Status: DC | PRN

## 2015-08-22 NOTE — Telephone Encounter (Addendum)
Pt request refill of Percocet 5/325mg  #30 1-2 tablets every 6 hours prn pain. Ardelle Anton Wagoner ordered Vicodin 5/325mg  #30 1-2 tablets every 4-6 hours prn pain.  Pt is to pick the rx up in the [facility] office.

## 2015-08-22 NOTE — Telephone Encounter (Signed)
Decrease to vicodin 5/325 1-2 tabs PO q4-6h prn pain disp #30. Thanks.

## 2015-08-23 MED ORDER — HYDROCODONE-ACETAMINOPHEN 5-325 MG PO TABS: ORAL_TABLET | ORAL | Status: DC

## 2015-09-04 ENCOUNTER — Ambulatory Visit (INDEPENDENT_AMBULATORY_CARE_PROVIDER_SITE_OTHER): Payer: BLUE CROSS/BLUE SHIELD

## 2015-09-04 ENCOUNTER — Ambulatory Visit (INDEPENDENT_AMBULATORY_CARE_PROVIDER_SITE_OTHER): Payer: BLUE CROSS/BLUE SHIELD | Admitting: Podiatry

## 2015-09-04 ENCOUNTER — Encounter: Payer: Self-pay | Admitting: Podiatry

## 2015-09-04 VITALS — BP 136/84 | HR 85 | Resp 16

## 2015-09-04 DIAGNOSIS — M2011 Hallux valgus (acquired), right foot: Secondary | ICD-10-CM | POA: Diagnosis not present

## 2015-09-04 DIAGNOSIS — Z9889 Other specified postprocedural states: Secondary | ICD-10-CM

## 2015-09-04 NOTE — Progress Notes (Signed)
Patient ID: Michael Burnett, male   DOB: 1956/01/12, 59 y.o.   MRN: [id number]  DOS: 07/19/15 /sp R Austin bunionectomy and 3rd metatarsal osteotomy  Subjective: 59 year old male presents the office today  status post right Austin bunionectomy, third metatarsal osteotomy. Overall he is doing well. He is not taking any pain medication. He did call the office asking for pain medicine but the pain subsided he did not pick up the prescription. He is continuing the [place] shoe. Denies any systemic complaints as fevers, chills, nausea, vomiting. Denies any calf pain, chest pain, concerns of breath. No other complaints at this time in no acute changes since last appointment.  Objective: AAO x3, NAD DP/PT pulses palpable bilaterally, CRT less than 3 seconds Protective sensation intact with Simms Weinstein monofilament Incision on the dorsal medial aspect of the right foot as well as the third metatarsal distally is well coapted without any evidence of dehiscence and a scar has formed. There is no surrounding erythema, ascending cellulitis , fluctuance, crepitus, malodor, drainage/purulence. There is no pain to palpation overlying the surgical site. No significant overlying edema. No pain with 1st or 3rd MTPJ ROM.  No pain to the contralateral lower extremity.   Hallux sits in a rectus position. No other areas of tenderness to bilateral lower extremities. MMT 5/5, ROM WNL.  No open lesions or pre-ulcerative lesions.  No overlying edema, erythema, increase in warmth to bilateral lower extremities.  No pain with calf compression, swelling, warmth, erythema bilaterally.   Assessment: 59 year old male  s/p R Austin bunionectomy and 3rd metatarsal osteotomy.   Plan: -X-rays were obtained and reviewed with the patient.  -Treatment options discussed including all alternatives, risks, and complications -Can start to transition to weightbearing as tolerated in a regular shoe. There is any increased pain to return to  the surgical shoe.  -Ice and elevation -Pain medication as needed -ROM of 1st MTPJ; bunion splint dispensed. On x-ray does appear to be a slight varus however clinically the toe is straight. -Monitor for any clinical signs or symptoms of infection and directed to call the office immediately should any occur or go to the ER. -Follow-up 3 week or sooner if any problems arise. In the meantime, encouraged to call the office with any questions, concerns, change in symptoms.   Ovid Curd, DPM

## 2015-09-25 ENCOUNTER — Encounter: Payer: BLUE CROSS/BLUE SHIELD | Admitting: Podiatry

## 2015-11-06 DIAGNOSIS — M2011 Hallux valgus (acquired), right foot: Secondary | ICD-10-CM

## 2016-08-13 NOTE — Progress Notes (Signed)
DOS 08.17.2016 Right foot bunionectomy; third metatarsal osteotomy both with wire/screw fixation

## 2021-12-07 NOTE — Patient Instructions (Addendum)
DUE TO COVID-19 ONLY ONE VISITOR IS ALLOWED TO COME WITH YOU AND STAY IN THE WAITING ROOM ONLY DURING PRE OP AND PROCEDURE.   **NO VISITORS ARE ALLOWED IN THE SHORT STAY AREA OR RECOVERY ROOM!!**  IF YOU WILL BE ADMITTED INTO THE HOSPITAL YOU ARE ALLOWED ONLY TWO SUPPORT PEOPLE DURING VISITATION HOURS ONLY (7 AM -8PM)   The support person(s) must pass our screening, gel in and out, and wear a mask at all times, including in the patients room. Patients must also wear a mask when staff or their support person are in the room. Visitors GUEST BADGE MUST BE WORN VISIBLY  One adult visitor may remain with you overnight and MUST be in the room by 8 P.M.  No visitors under the age of 102. Any visitor under the age of 75 must be accompanied by an adult.    COVID SWAB TESTING MUST BE COMPLETED ON:  12/18/21 @ 8:00 AM   Site: [facility] [address] [place] Enter: Main Entrance have a seat in the waiting area to the right of main entrance (DO NOT CHECK IN AT ADMITTING!!!!!) Dial: [phone] to alert staff you have arrived  You are not required to quarantine, however you are required to wear a well-fitted mask when you are out and around people not in your household.  Hand Hygiene often Do NOT share personal items Notify your provider if you are in close contact with someone who has COVID or you develop fever 100.4 or greater, new onset of sneezing, cough, sore throat, shortness of breath or body aches.  [facility] Entrance [address], [address], must go inside of the hospital, NOT A DRIVE THRU!  (Must self quarantine after testing. Follow instructions on handout.)       Your procedure is scheduled on: 12/20/21   Report to [facility] Main Entrance    Report to admitting at : 8:00 AM   Call this number if you have problems the morning of surgery [phone]   Do not eat food :After Midnight.   May have liquids until  : 7:45 AM   day of surgery  CLEAR LIQUID DIET  Foods Allowed                                                                     Foods Excluded  Water, Black Coffee and tea, regular and decaf                             liquids that you cannot  Plain Jell-O in any flavor  (No red)                                           see through such as: Fruit ices (not with fruit pulp)                                     milk, soups, orange juice  Iced Popsicles (No red)                                    All solid food                                   Apple juices Sports drinks like Gatorade (No red) Lightly seasoned clear broth or consume(fat free) Sugar  Sample Menu Breakfast                                Lunch                                     Supper Cranberry juice                    Beef broth                            Chicken broth Jell-O                                     Grape juice                           Apple juice Coffee or tea                        Jell-O                                      Popsicle                                                Coffee or tea                        Coffee or tea    Complete one Gatorade drink the morning of surgery at: 7:45 AM       the day of surgery.    The day of surgery:  Drink ONE (1) Pre-Surgery Clear Ensure or G2 by am the morning of surgery. Drink in one sitting. Do not sip.  This drink was given to you during your hospital  pre-op appointment visit. Nothing else to drink after completing the  Pre-Surgery Clear Ensure or G2.          If you have questions, please contact your surgeons office.     Oral Hygiene is also important to reduce your risk of infection.                                    Remember - BRUSH YOUR TEETH THE MORNING OF SURGERY WITH YOUR REGULAR TOOTHPASTE   Do NOT smoke after Midnight   Take these medicines the morning  of surgery with A SIP OF WATER: amlodipine.  DO NOT TAKE ANY ORAL  DIABETIC MEDICATIONS DAY OF YOUR SURGERY                              You may not have any metal on your body including hair pins, jewelry, and body piercing             Do not wear lotions, powders, perfumes/cologne, or deodorant              Men may shave face and neck.   Do not bring valuables to the hospital. [place] [facility] IS NOT             RESPONSIBLE   FOR VALUABLES.   Contacts, dentures or bridgework may not be worn into surgery.   Bring small overnight bag day of surgery.    Patients discharged on the day of surgery will not be allowed to drive home.  We recommend you have a responsible adult to stay with you for the first 24 hours after anesthesia.   Special Instructions: Bring a copy of your healthcare power of attorney and living will documents         the day of surgery if you haven't scanned them before.              Please read over the following fact sheets you were given: IF YOU HAVE QUESTIONS ABOUT YOUR PRE-OP INSTRUCTIONS PLEASE CALL [phone]     [facility] Before surgery, you can play an important role.  Because skin is not sterile, your skin needs to be as free of germs as possible.  You can reduce the number of germs on your skin by washing with CHG (chlorahexidine gluconate) soap before surgery.  CHG is an antiseptic cleaner which kills germs and bonds with the skin to continue killing germs even after washing. Please DO NOT use if you have an allergy to CHG or antibacterial soaps.  If your skin becomes reddened/irritated stop using the CHG and inform your nurse when you arrive at Short Stay. Do not shave (including legs and underarms) for at least 48 hours prior to the first CHG shower.  You may shave your face/neck. Please follow these instructions carefully:  1.  Shower with CHG Soap the night before surgery and the  morning of Surgery.  2.  If you choose to wash your hair, wash your hair first as usual with your  normal  shampoo.  3.   After you shampoo, rinse your hair and body thoroughly to remove the  shampoo.                           4.  Use CHG as you would any other liquid soap.  You can apply chg directly  to the skin and wash                       Gently with a scrungie or clean washcloth.  5.  Apply the CHG Soap to your body ONLY FROM THE NECK DOWN.   Do not use on face/ open                           Wound or open sores. Avoid contact with eyes, ears mouth and genitals (private parts).  Wash face,  Genitals (private parts) with your normal soap.             6.  Wash thoroughly, paying special attention to the area where your surgery  will be performed.  7.  Thoroughly rinse your body with warm water from the neck down.  8.  DO NOT shower/wash with your normal soap after using and rinsing off  the CHG Soap.                9.  Pat yourself dry with a clean towel.            10.  Wear clean pajamas.            11.  Place clean sheets on your bed the night of your first shower and do not  sleep with pets. Day of Surgery : Do not apply any lotions/deodorants the morning of surgery.  Please wear clean clothes to the hospital/surgery center.  FAILURE TO FOLLOW THESE INSTRUCTIONS MAY RESULT IN THE CANCELLATION OF YOUR SURGERY PATIENT SIGNATURE_________________________________  NURSE SIGNATURE__________________________________  ________________________________________________________________________   Michael Burnett  An incentive spirometer is a tool that can help keep your lungs clear and active. This tool measures how well you are filling your lungs with each breath. Taking long deep breaths may help reverse or decrease the chance of developing breathing (pulmonary) problems (especially infection) following: A long period of time when you are unable to move or be active. BEFORE THE PROCEDURE  If the spirometer includes an indicator to show your best effort, your nurse or respiratory  therapist will set it to a desired goal. If possible, sit up straight or lean slightly forward. Try not to slouch. Hold the incentive spirometer in an upright position. INSTRUCTIONS FOR USE  Sit on the edge of your bed if possible, or sit up as far as you can in bed or on a chair. Hold the incentive spirometer in an upright position. Breathe out normally. Place the mouthpiece in your mouth and seal your lips tightly around it. Breathe in slowly and as deeply as possible, raising the piston or the ball toward the top of the column. Hold your breath for 3-5 seconds or for as long as possible. Allow the piston or ball to fall to the bottom of the column. Remove the mouthpiece from your mouth and breathe out normally. Rest for a few seconds and repeat Steps 1 through 7 at least 10 times every 1-2 hours when you are awake. Take your time and take a few normal breaths between deep breaths. The spirometer may include an indicator to show your best effort. Use the indicator as a goal to work toward during each repetition. After each set of 10 deep breaths, practice coughing to be sure your lungs are clear. If you have an incision (the cut made at the time of surgery), support your incision when coughing by placing a pillow or rolled up towels firmly against it. Once you are able to get out of bed, walk around indoors and cough well. You may stop using the incentive spirometer when instructed by your caregiver.  RISKS AND COMPLICATIONS Take your time so you do not get dizzy or light-headed. If you are in pain, you may need to take or ask for pain medication before doing incentive spirometry. It is harder to take a deep breath if you are having pain. AFTER USE Rest and breathe slowly and easily. It can be helpful to keep track of  a log of your progress. Your caregiver can provide you with a simple table to help with this. If you are using the spirometer at home, follow these instructions: SEEK MEDICAL  CARE IF:  You are having difficultly using the spirometer. You have trouble using the spirometer as often as instructed. Your pain medication is not giving enough relief while using the spirometer. You develop fever of 100.5 F (38.1 C) or higher. SEEK IMMEDIATE MEDICAL CARE IF:  You cough up bloody sputum that had not been present before. You develop fever of 102 F (38.9 C) or greater. You develop worsening pain at or near the incision site. MAKE SURE YOU:  Understand these instructions. Will watch your condition. Will get help right away if you are not doing well or get worse. Document Released: 03/31/2007 Document Revised: 02/10/2012 Document Reviewed: 06/01/2007 [facility] Patient Information 2014 [employer] [place].   ________________________________________________________________________

## 2021-12-10 ENCOUNTER — Encounter (HOSPITAL_COMMUNITY): Payer: Self-pay

## 2021-12-10 ENCOUNTER — Other Ambulatory Visit: Payer: Self-pay

## 2021-12-10 ENCOUNTER — Encounter (HOSPITAL_COMMUNITY)
Admission: RE | Admit: 2021-12-10 | Discharge: 2021-12-10 | Disposition: A | Discharge status: Home / Self Care | Payer: Medicare HMO | Source: Ambulatory Visit | Attending: Orthopedic Surgery | Admitting: Orthopedic Surgery

## 2021-12-10 VITALS — BP 145/94 | HR 79 | Temp 98.1°F | Resp 18 | Ht 70.0 in | Wt 197.0 lb

## 2021-12-10 DIAGNOSIS — Z01818 Encounter for other preprocedural examination: Secondary | ICD-10-CM | POA: Diagnosis not present

## 2021-12-10 DIAGNOSIS — E119 Type 2 diabetes mellitus without complications: Secondary | ICD-10-CM | POA: Insufficient documentation

## 2021-12-10 DIAGNOSIS — M1612 Unilateral primary osteoarthritis, left hip: Secondary | ICD-10-CM | POA: Diagnosis not present

## 2021-12-10 HISTORY — DX: Essential (primary) hypertension: I10

## 2021-12-10 HISTORY — DX: Type 2 diabetes mellitus without complications: E11.9

## 2021-12-10 HISTORY — DX: Chronic kidney disease, unspecified: N18.9

## 2021-12-10 LAB — COMPREHENSIVE METABOLIC PANEL
ALT: 35 U/L (ref 0–44)
AST: 28 U/L (ref 15–41)
Albumin: 4 g/dL (ref 3.5–5.0)
Alkaline Phosphatase: 42 U/L (ref 38–126)
Anion gap: 8 (ref 5–15)
BUN: 17 mg/dL (ref 8–23)
CO2: 23 mmol/L (ref 22–32)
Calcium: 8.9 mg/dL (ref 8.9–10.3)
Chloride: 107 mmol/L (ref 98–111)
Creatinine, Ser: 1.47 mg/dL — ABNORMAL HIGH (ref 0.61–1.24)
GFR, Estimated: 52 mL/min — ABNORMAL LOW (ref >60)
Glucose, Bld: 101 mg/dL — ABNORMAL HIGH (ref 70–99)
Potassium: 3.9 mmol/L (ref 3.5–5.1)
Sodium: 138 mmol/L (ref 135–145)
Total Bilirubin: 0.9 mg/dL (ref 0.3–1.2)
Total Protein: 7.5 g/dL (ref 6.5–8.1)

## 2021-12-10 LAB — CBC
HCT: 47.4 % (ref 39.0–52.0)
Hemoglobin: 15.8 g/dL (ref 13.0–17.0)
MCH: 27.7 pg (ref 26.0–34.0)
MCHC: 33.3 g/dL (ref 30.0–36.0)
MCV: 83 fL (ref 80.0–100.0)
Platelets: 189 10*3/uL (ref 150–400)
RBC: 5.71 MIL/uL (ref 4.22–5.81)
RDW: 14.6 % (ref 11.5–15.5)
WBC: 3.8 10*3/uL — ABNORMAL LOW (ref 4.0–10.5)
nRBC: 0 % (ref 0.0–0.2)

## 2021-12-10 LAB — GLUCOSE, CAPILLARY: Glucose-Capillary: 111 mg/dL — ABNORMAL HIGH (ref 70–99)

## 2021-12-10 LAB — SURGICAL PCR SCREEN
MRSA, PCR: NEGATIVE
Staphylococcus aureus: NEGATIVE

## 2021-12-10 LAB — HEMOGLOBIN A1C
Hgb A1c MFr Bld: 5.8 % — ABNORMAL HIGH (ref 4.8–5.6)
Mean Plasma Glucose: 119.76 mg/dL

## 2021-12-10 NOTE — Progress Notes (Addendum)
COVID Vaccine Completed: Yes Date COVID Vaccine completed: 01/17/21 x 3 COVID vaccine manufacturer:  Moderna    COVID Test: 12/18/21 @ 8:00 AM PCP - Dr. Wallie Renshaw Cardiologist -   Chest x-ray -  EKG - requested Stress Test -  ECHO -  Cardiac Cath -  Pacemaker/ICD device last checked:  Sleep Study -  CPAP -   Fasting Blood Sugar -  Checks Blood Sugar _____ times a day  Blood Thinner Instructions: Aspirin Instructions: Last Dose:  Anesthesia review: Hx: DIA,HTN  Patient denies shortness of breath, fever, cough and chest pain at PAT appointment   Patient verbalized understanding of instructions that were given to them at the PAT appointment. Patient was also instructed that they will need to review over the PAT instructions again at home before surgery.

## 2021-12-18 ENCOUNTER — Other Ambulatory Visit: Payer: Self-pay

## 2021-12-18 ENCOUNTER — Encounter (HOSPITAL_COMMUNITY)
Admission: RE | Admit: 2021-12-18 | Discharge: 2021-12-18 | Disposition: A | Discharge status: Home / Self Care | Payer: Medicare HMO | Source: Ambulatory Visit | Attending: Orthopedic Surgery | Admitting: Orthopedic Surgery

## 2021-12-18 DIAGNOSIS — Z20822 Contact with and (suspected) exposure to covid-19: Secondary | ICD-10-CM | POA: Diagnosis not present

## 2021-12-18 DIAGNOSIS — Z01818 Encounter for other preprocedural examination: Secondary | ICD-10-CM

## 2021-12-18 DIAGNOSIS — Z01812 Encounter for preprocedural laboratory examination: Secondary | ICD-10-CM | POA: Insufficient documentation

## 2021-12-18 LAB — SARS CORONAVIRUS 2 (TAT 6-24 HRS): SARS Coronavirus 2: NEGATIVE

## 2021-12-19 NOTE — H&P (Signed)
TOTAL HIP ADMISSION H&P  Patient is admitted for left total hip arthroplasty.  Subjective:  Chief Complaint: left hip pain  HPI: Michael Burnett, 66 y.o. male, has a history of pain and functional disability in the left hip(s) due to arthritis and patient has failed non-surgical conservative treatments for greater than 12 weeks to include NSAID's and/or analgesics and activity modification.  Onset of symptoms was gradual starting 2 years ago with gradually worsening course since that time.The patient noted no past surgery on the right hip(s).  Patient currently rates pain in the right hip at 8 out of 10 with activity. Patient has worsening of pain with activity and weight bearing, pain that interfers with activities of daily living, and pain with passive range of motion. Patient has evidence of joint space narrowing by imaging studies. This condition presents safety issues increasing the risk of falls. There is no current active infection.  There are no problems to display for this patient.  Past Medical History:  Diagnosis Date   Chronic kidney disease    Diabetes mellitus without complication ([place])    Hypertension     Past Surgical History:  Procedure Laterality Date   OSTEOTOMY TOES Bilateral    REPLACEMENT TOTAL HIP W/  RESURFACING IMPLANTS Right     No current facility-administered medications for this encounter.   Current Outpatient Medications  Medication Sig Dispense Refill Last Dose   amLODipine (NORVASC) 10 MG tablet Take 10 mg by mouth daily.      aspirin EC 81 MG tablet Take 81 mg by mouth daily. Swallow whole.      atorvastatin (LIPITOR) 10 MG tablet Take 10 mg by mouth daily.      losartan (COZAAR) 100 MG tablet Take 100 mg by mouth daily.      metFORMIN (GLUCOPHAGE-XR) 500 MG 24 hr tablet Take 500 mg by mouth daily.      Omega-3 Krill Oil 500 MG CAPS Take 500 mg by mouth daily.      VIAGRA 100 MG tablet Take 100 mg by mouth as needed for erectile dysfunction.  5    No  Known Allergies  Social History   Tobacco Use   Smoking status: Never   Smokeless tobacco: Not on file  Substance Use Topics   Alcohol use: No    Alcohol/week: 0.0 standard drinks    No family history on file.   Review of Systems  Constitutional:  Negative for chills and fever.  Respiratory:  Negative for cough and shortness of breath.   Cardiovascular:  Negative for chest pain.  Gastrointestinal:  Negative for nausea and vomiting.  Musculoskeletal:  Positive for arthralgias.    Objective:  Physical Exam Well nourished and well developed. General: Alert and oriented x3, cooperative and pleasant, no acute distress. Head: normocephalic, atraumatic, neck supple. Eyes: EOMI.  Musculoskeletal: Left hip exam: He does have reproducible discomfort in his left hip girdle anteriorly with hip flexion internal rotation between 5 and 10 degrees with tightness as well as reproducible discomfort with external rotation over 20 degrees Mild tenderness laterally Active hip flexion to 120 degrees with slight external rotation contracture  Calves soft and nontender. Motor function intact in LE. Strength 5/5 LE bilaterally. Neuro: Distal pulses 2+. Sensation to light touch intact in LE.  Vital signs in last 24 hours:    Labs:   Estimated body mass index is 28.27 kg/m as calculated from the following:   Height as of 12/10/21: 5\' 10"  (1.778 m).   Weight  as of 12/10/21: 89.4 kg.   Imaging Review Plain radiographs demonstrate severe degenerative joint disease of the right hip(s). The bone quality appears to be adequate for age and reported activity level.      Assessment/Plan:  End stage arthritis, right hip(s)  The patient history, physical examination, clinical judgement of the provider and imaging studies are consistent with end stage degenerative joint disease of the right hip(s) and total hip arthroplasty is deemed medically necessary. The treatment options including medical  management, injection therapy, arthroscopy and arthroplasty were discussed at length. The risks and benefits of total hip arthroplasty were presented and reviewed. The risks due to aseptic loosening, infection, stiffness, dislocation/subluxation,  thromboembolic complications and other imponderables were discussed.  The patient acknowledged the explanation, agreed to proceed with the plan and consent was signed. Patient is being admitted for inpatient treatment for surgery, pain control, PT, OT, prophylactic antibiotics, VTE prophylaxis, progressive ambulation and ADL's and discharge planning.The patient is planning to be discharged  home.  Therapy Plans: HEP Disposition: Home with wife Planned DVT Prophylaxis: aspirin 81mg  BID DME needed: walker PCP: Abigail Miyamoto, NP (office told patient they were faxing clearance yesterday) TXA: IV Allergies: NKDA Anesthesia Concerns: none BMI: 28.6 Last HgbA1c: Borderline, on metformin   Other: - Oxycodone, robaxin, tylenol  Costella Hatcher, PA-C Orthopedic Surgery [facility] [place] [phone]

## 2021-12-19 NOTE — Anesthesia Preprocedure Evaluation (Addendum)
Anesthesia Evaluation  Patient identified by MRN, date of birth, ID band Patient awake    Reviewed: Allergy & Precautions, NPO status , Patient's Chart, lab work & pertinent test results  History of Anesthesia Complications Negative for: history of anesthetic complications  Airway Mallampati: II  TM Distance: >3 FB Neck ROM: Full    Dental no notable dental hx. (+) Dental Advisory Given   Pulmonary neg pulmonary ROS,    Pulmonary exam normal        Cardiovascular hypertension, Pt. on medications Normal cardiovascular exam     Neuro/Psych negative neurological ROS     GI/Hepatic negative GI ROS, Neg liver ROS,   Endo/Other  diabetes  Renal/GU Renal InsufficiencyRenal disease     Musculoskeletal negative musculoskeletal ROS (+)   Abdominal   Peds  Hematology negative hematology ROS (+)   Anesthesia Other Findings   Reproductive/Obstetrics                            Anesthesia Physical Anesthesia Plan  ASA: 3  Anesthesia Plan: Spinal and MAC   Post-op Pain Management:    Induction:   PONV Risk Score and Plan: 2 and Ondansetron and Propofol infusion  Airway Management Planned: Natural Airway  Additional Equipment:   Intra-op Plan:   Post-operative Plan:   Informed Consent: I have reviewed the patients History and Physical, chart, labs and discussed the procedure including the risks, benefits and alternatives for the proposed anesthesia with the patient or authorized representative who has indicated his/her understanding and acceptance.     Dental advisory given  Plan Discussed with: Anesthesiologist and CRNA  Anesthesia Plan Comments:        Anesthesia Quick Evaluation

## 2021-12-20 ENCOUNTER — Other Ambulatory Visit: Payer: Self-pay

## 2021-12-20 ENCOUNTER — Ambulatory Visit (HOSPITAL_COMMUNITY): Payer: Medicare HMO | Admitting: Anesthesiology

## 2021-12-20 ENCOUNTER — Ambulatory Visit (HOSPITAL_COMMUNITY): Payer: Medicare HMO

## 2021-12-20 ENCOUNTER — Encounter (HOSPITAL_COMMUNITY)
Admission: RE | Disposition: A | Discharge status: Home / Self Care | Payer: Self-pay | Source: Ambulatory Visit | Attending: Orthopedic Surgery

## 2021-12-20 ENCOUNTER — Encounter (HOSPITAL_COMMUNITY): Payer: Self-pay | Admitting: Orthopedic Surgery

## 2021-12-20 ENCOUNTER — Observation Stay (HOSPITAL_COMMUNITY)
Admission: RE | Admit: 2021-12-20 | Discharge: 2021-12-21 | Disposition: A | Discharge status: Home / Self Care | Payer: Medicare HMO | Source: Ambulatory Visit | Attending: Orthopedic Surgery | Admitting: Orthopedic Surgery

## 2021-12-20 ENCOUNTER — Observation Stay (HOSPITAL_COMMUNITY): Payer: Medicare HMO

## 2021-12-20 DIAGNOSIS — N189 Chronic kidney disease, unspecified: Secondary | ICD-10-CM | POA: Insufficient documentation

## 2021-12-20 DIAGNOSIS — Z7982 Long term (current) use of aspirin: Secondary | ICD-10-CM | POA: Insufficient documentation

## 2021-12-20 DIAGNOSIS — I129 Hypertensive chronic kidney disease with stage 1 through stage 4 chronic kidney disease, or unspecified chronic kidney disease: Secondary | ICD-10-CM | POA: Insufficient documentation

## 2021-12-20 DIAGNOSIS — Z96642 Presence of left artificial hip joint: Secondary | ICD-10-CM

## 2021-12-20 DIAGNOSIS — M25552 Pain in left hip: Secondary | ICD-10-CM | POA: Diagnosis present

## 2021-12-20 DIAGNOSIS — Z96649 Presence of unspecified artificial hip joint: Secondary | ICD-10-CM

## 2021-12-20 DIAGNOSIS — Z79899 Other long term (current) drug therapy: Secondary | ICD-10-CM | POA: Insufficient documentation

## 2021-12-20 DIAGNOSIS — Z7984 Long term (current) use of oral hypoglycemic drugs: Secondary | ICD-10-CM | POA: Insufficient documentation

## 2021-12-20 DIAGNOSIS — M25752 Osteophyte, left hip: Secondary | ICD-10-CM | POA: Insufficient documentation

## 2021-12-20 DIAGNOSIS — Z419 Encounter for procedure for purposes other than remedying health state, unspecified: Secondary | ICD-10-CM

## 2021-12-20 DIAGNOSIS — M1612 Unilateral primary osteoarthritis, left hip: Secondary | ICD-10-CM | POA: Diagnosis not present

## 2021-12-20 DIAGNOSIS — Z96641 Presence of right artificial hip joint: Secondary | ICD-10-CM | POA: Insufficient documentation

## 2021-12-20 DIAGNOSIS — E1122 Type 2 diabetes mellitus with diabetic chronic kidney disease: Secondary | ICD-10-CM | POA: Diagnosis not present

## 2021-12-20 HISTORY — PX: TOTAL HIP ARTHROPLASTY: SHX124

## 2021-12-20 LAB — GLUCOSE, CAPILLARY
Glucose-Capillary: 125 mg/dL — ABNORMAL HIGH (ref 70–99)
Glucose-Capillary: 116 mg/dL — ABNORMAL HIGH (ref 70–99)
Glucose-Capillary: 111 mg/dL — ABNORMAL HIGH (ref 70–99)
Glucose-Capillary: 127 mg/dL — ABNORMAL HIGH (ref 70–99)

## 2021-12-20 LAB — TYPE AND SCREEN
ABO/RH(D): O POS
Antibody Screen: NEGATIVE

## 2021-12-20 LAB — ABO/RH: ABO/RH(D): O POS

## 2021-12-20 SURGERY — ARTHROPLASTY, HIP, TOTAL, ANTERIOR APPROACH
Anesthesia: Monitor Anesthesia Care | Site: Hip | Laterality: Left

## 2021-12-20 MED ORDER — METHOCARBAMOL 500 MG IVPB - SIMPLE MED
500.0000 mg | Freq: Four times a day (QID) | INTRAVENOUS | Status: DC | PRN
  Administered 2021-12-20: 500 mg via INTRAVENOUS
  Filled 2021-12-20: qty 50

## 2021-12-20 MED ORDER — PROPOFOL 10 MG/ML IV BOLUS
INTRAVENOUS | Status: DC | PRN
  Administered 2021-12-20: 10 mg via INTRAVENOUS

## 2021-12-20 MED ORDER — MIDAZOLAM HCL 2 MG/2ML IJ SOLN
INTRAMUSCULAR | Status: AC
  Filled 2021-12-20: qty 2

## 2021-12-20 MED ORDER — LACTATED RINGERS IV SOLN: INTRAVENOUS | Status: DC

## 2021-12-20 MED ORDER — FENTANYL CITRATE PF 50 MCG/ML IJ SOSY
PREFILLED_SYRINGE | INTRAMUSCULAR | Status: AC
  Filled 2021-12-20: qty 2

## 2021-12-20 MED ORDER — ASPIRIN 81 MG PO CHEW
81.0000 mg | CHEWABLE_TABLET | Freq: Two times a day (BID) | ORAL | Status: DC
  Administered 2021-12-20 – 2021-12-21 (×2): 81 mg via ORAL
  Filled 2021-12-20 (×2): qty 1

## 2021-12-20 MED ORDER — ACETAMINOPHEN 325 MG PO TABS
325.0000 mg | ORAL_TABLET | Freq: Four times a day (QID) | ORAL | Status: DC | PRN
  Administered 2021-12-21: 650 mg via ORAL
  Filled 2021-12-20: qty 2

## 2021-12-20 MED ORDER — ACETAMINOPHEN 500 MG PO TABS
1000.0000 mg | ORAL_TABLET | Freq: Once | ORAL | Status: AC
  Administered 2021-12-20: 1000 mg via ORAL
  Filled 2021-12-20: qty 2

## 2021-12-20 MED ORDER — BUPIVACAINE IN DEXTROSE 0.75-8.25 % IT SOLN
INTRATHECAL | Status: DC | PRN
  Administered 2021-12-20: 1.8 mL via INTRATHECAL

## 2021-12-20 MED ORDER — LOSARTAN POTASSIUM 50 MG PO TABS
100.0000 mg | ORAL_TABLET | Freq: Every day | ORAL | Status: DC
  Administered 2021-12-21: 100 mg via ORAL
  Filled 2021-12-20: qty 2

## 2021-12-20 MED ORDER — PHENYLEPHRINE 40 MCG/ML (10ML) SYRINGE FOR IV PUSH (FOR BLOOD PRESSURE SUPPORT)
PREFILLED_SYRINGE | INTRAVENOUS | Status: DC | PRN
  Administered 2021-12-20: 80 ug via INTRAVENOUS

## 2021-12-20 MED ORDER — FENTANYL CITRATE PF 50 MCG/ML IJ SOSY
25.0000 ug | PREFILLED_SYRINGE | INTRAMUSCULAR | Status: DC | PRN
  Administered 2021-12-20 (×2): 25 ug via INTRAVENOUS
  Administered 2021-12-20: 50 ug via INTRAVENOUS
  Administered 2021-12-20: 25 ug via INTRAVENOUS

## 2021-12-20 MED ORDER — PROPOFOL 1000 MG/100ML IV EMUL
INTRAVENOUS | Status: AC
  Filled 2021-12-20: qty 100

## 2021-12-20 MED ORDER — ATORVASTATIN CALCIUM 10 MG PO TABS
10.0000 mg | ORAL_TABLET | Freq: Every day | ORAL | Status: DC
  Administered 2021-12-21: 10 mg via ORAL
  Filled 2021-12-20: qty 1

## 2021-12-20 MED ORDER — DEXAMETHASONE SODIUM PHOSPHATE 10 MG/ML IJ SOLN
10.0000 mg | Freq: Once | INTRAMUSCULAR | Status: AC
  Administered 2021-12-21: 10 mg via INTRAVENOUS
  Filled 2021-12-20: qty 1

## 2021-12-20 MED ORDER — TRANEXAMIC ACID-NACL 1000-0.7 MG/100ML-% IV SOLN
1000.0000 mg | INTRAVENOUS | Status: AC
  Administered 2021-12-20: 1000 mg via INTRAVENOUS
  Filled 2021-12-20: qty 100

## 2021-12-20 MED ORDER — METOCLOPRAMIDE HCL 5 MG PO TABS: 5.0000 mg | ORAL_TABLET | Freq: Three times a day (TID) | ORAL | Status: DC | PRN

## 2021-12-20 MED ORDER — LIDOCAINE HCL (PF) 2 % IJ SOLN
INTRAMUSCULAR | Status: AC
  Filled 2021-12-20: qty 5

## 2021-12-20 MED ORDER — OXYCODONE HCL 5 MG PO TABS
10.0000 mg | ORAL_TABLET | ORAL | Status: DC | PRN
  Administered 2021-12-20: 15 mg via ORAL
  Filled 2021-12-20: qty 3

## 2021-12-20 MED ORDER — AMISULPRIDE (ANTIEMETIC) 5 MG/2ML IV SOLN: 10.0000 mg | Freq: Once | INTRAVENOUS | Status: DC | PRN

## 2021-12-20 MED ORDER — MENTHOL 3 MG MT LOZG: 1.0000 | LOZENGE | OROMUCOSAL | Status: DC | PRN

## 2021-12-20 MED ORDER — FENTANYL CITRATE (PF) 100 MCG/2ML IJ SOLN
INTRAMUSCULAR | Status: DC | PRN
  Administered 2021-12-20 (×2): 50 ug via INTRAVENOUS

## 2021-12-20 MED ORDER — PROMETHAZINE HCL 25 MG/ML IJ SOLN: 6.2500 mg | INTRAMUSCULAR | Status: DC | PRN

## 2021-12-20 MED ORDER — FERROUS SULFATE 325 (65 FE) MG PO TABS
325.0000 mg | ORAL_TABLET | Freq: Three times a day (TID) | ORAL | Status: DC
  Administered 2021-12-20 – 2021-12-21 (×3): 325 mg via ORAL
  Filled 2021-12-20 (×3): qty 1

## 2021-12-20 MED ORDER — PHENYLEPHRINE HCL-NACL 20-0.9 MG/250ML-% IV SOLN
INTRAVENOUS | Status: DC | PRN
  Administered 2021-12-20: 25 ug/min via INTRAVENOUS

## 2021-12-20 MED ORDER — ONDANSETRON HCL 4 MG/2ML IJ SOLN: 4.0000 mg | Freq: Four times a day (QID) | INTRAMUSCULAR | Status: DC | PRN

## 2021-12-20 MED ORDER — METOCLOPRAMIDE HCL 5 MG/ML IJ SOLN: 5.0000 mg | Freq: Three times a day (TID) | INTRAMUSCULAR | Status: DC | PRN

## 2021-12-20 MED ORDER — DEXAMETHASONE SODIUM PHOSPHATE 10 MG/ML IJ SOLN: 8.0000 mg | Freq: Once | INTRAMUSCULAR | Status: DC

## 2021-12-20 MED ORDER — MIDAZOLAM HCL 2 MG/2ML IJ SOLN
INTRAMUSCULAR | Status: DC | PRN
  Administered 2021-12-20 (×2): 1 mg via INTRAVENOUS

## 2021-12-20 MED ORDER — DIPHENHYDRAMINE HCL 12.5 MG/5ML PO ELIX: 12.5000 mg | ORAL_SOLUTION | ORAL | Status: DC | PRN

## 2021-12-20 MED ORDER — CEFAZOLIN SODIUM-DEXTROSE 2-4 GM/100ML-% IV SOLN
2.0000 g | Freq: Four times a day (QID) | INTRAVENOUS | Status: AC
  Administered 2021-12-20 (×2): 2 g via INTRAVENOUS
  Filled 2021-12-20 (×2): qty 100

## 2021-12-20 MED ORDER — METHOCARBAMOL 500 MG PO TABS
500.0000 mg | ORAL_TABLET | Freq: Four times a day (QID) | ORAL | Status: DC | PRN
  Administered 2021-12-21: 500 mg via ORAL
  Filled 2021-12-20: qty 1

## 2021-12-20 MED ORDER — METHOCARBAMOL 500 MG IVPB - SIMPLE MED
INTRAVENOUS | Status: AC
  Filled 2021-12-20: qty 50

## 2021-12-20 MED ORDER — BISACODYL 10 MG RE SUPP: 10.0000 mg | Freq: Every day | RECTAL | Status: DC | PRN

## 2021-12-20 MED ORDER — LIDOCAINE HCL (CARDIAC) PF 100 MG/5ML IV SOSY
PREFILLED_SYRINGE | INTRAVENOUS | Status: DC | PRN
  Administered 2021-12-20: 40 mg via INTRAVENOUS

## 2021-12-20 MED ORDER — TRANEXAMIC ACID-NACL 1000-0.7 MG/100ML-% IV SOLN
1000.0000 mg | Freq: Once | INTRAVENOUS | Status: AC
  Administered 2021-12-20: 1000 mg via INTRAVENOUS
  Filled 2021-12-20: qty 100

## 2021-12-20 MED ORDER — PHENYLEPHRINE HCL (PRESSORS) 10 MG/ML IV SOLN
INTRAVENOUS | Status: AC
  Filled 2021-12-20: qty 1

## 2021-12-20 MED ORDER — AMLODIPINE BESYLATE 10 MG PO TABS
10.0000 mg | ORAL_TABLET | Freq: Every day | ORAL | Status: DC
  Administered 2021-12-21: 10 mg via ORAL
  Filled 2021-12-20: qty 1

## 2021-12-20 MED ORDER — CEFAZOLIN SODIUM-DEXTROSE 2-4 GM/100ML-% IV SOLN
2.0000 g | INTRAVENOUS | Status: AC
  Administered 2021-12-20: 2 g via INTRAVENOUS
  Filled 2021-12-20: qty 100

## 2021-12-20 MED ORDER — PHENOL 1.4 % MT LIQD
1.0000 | OROMUCOSAL | Status: DC | PRN
Start: 2021-12-20 — End: 2021-12-21

## 2021-12-20 MED ORDER — HYDROMORPHONE HCL 1 MG/ML IJ SOLN
0.5000 mg | INTRAMUSCULAR | Status: DC | PRN
  Administered 2021-12-20: 1 mg via INTRAVENOUS
  Filled 2021-12-20: qty 1

## 2021-12-20 MED ORDER — PROPOFOL 10 MG/ML IV BOLUS
INTRAVENOUS | Status: AC
  Filled 2021-12-20: qty 20

## 2021-12-20 MED ORDER — PROPOFOL 500 MG/50ML IV EMUL
INTRAVENOUS | Status: DC | PRN
  Administered 2021-12-20: 50 ug/kg/min via INTRAVENOUS

## 2021-12-20 MED ORDER — POVIDONE-IODINE 10 % EX SWAB
2.0000 | Freq: Once | CUTANEOUS | Status: AC
Start: 2021-12-20 — End: 2021-12-20
  Administered 2021-12-20: 2 via TOPICAL

## 2021-12-20 MED ORDER — SODIUM CHLORIDE 0.9 % IR SOLN
Status: DC | PRN
  Administered 2021-12-20: 1000 mL

## 2021-12-20 MED ORDER — CELECOXIB 200 MG PO CAPS
200.0000 mg | ORAL_CAPSULE | Freq: Once | ORAL | Status: AC
  Administered 2021-12-20: 200 mg via ORAL
  Filled 2021-12-20: qty 1

## 2021-12-20 MED ORDER — ORAL CARE MOUTH RINSE: 15.0000 mL | Freq: Once | OROMUCOSAL | Status: AC

## 2021-12-20 MED ORDER — POLYETHYLENE GLYCOL 3350 17 G PO PACK: 17.0000 g | PACK | Freq: Every day | ORAL | Status: DC | PRN

## 2021-12-20 MED ORDER — INSULIN ASPART 100 UNIT/ML IJ SOLN
0.0000 [IU] | Freq: Three times a day (TID) | INTRAMUSCULAR | Status: DC
  Administered 2021-12-21: 2 [IU] via SUBCUTANEOUS

## 2021-12-20 MED ORDER — SODIUM CHLORIDE 0.9 % IV SOLN: INTRAVENOUS | Status: DC

## 2021-12-20 MED ORDER — ONDANSETRON HCL 4 MG PO TABS: 4.0000 mg | ORAL_TABLET | Freq: Four times a day (QID) | ORAL | Status: DC | PRN

## 2021-12-20 MED ORDER — FENTANYL CITRATE PF 50 MCG/ML IJ SOSY
PREFILLED_SYRINGE | INTRAMUSCULAR | Status: AC
  Filled 2021-12-20: qty 1

## 2021-12-20 MED ORDER — DOCUSATE SODIUM 100 MG PO CAPS
100.0000 mg | ORAL_CAPSULE | Freq: Two times a day (BID) | ORAL | Status: DC
  Administered 2021-12-20 – 2021-12-21 (×2): 100 mg via ORAL
  Filled 2021-12-20 (×2): qty 1

## 2021-12-20 MED ORDER — FENTANYL CITRATE (PF) 100 MCG/2ML IJ SOLN
INTRAMUSCULAR | Status: AC
  Filled 2021-12-20: qty 2

## 2021-12-20 MED ORDER — CHLORHEXIDINE GLUCONATE 0.12 % MT SOLN
15.0000 mL | Freq: Once | OROMUCOSAL | Status: AC
  Administered 2021-12-20: 15 mL via OROMUCOSAL

## 2021-12-20 MED ORDER — METFORMIN HCL ER 500 MG PO TB24
500.0000 mg | ORAL_TABLET | Freq: Every day | ORAL | Status: DC
  Administered 2021-12-21: 500 mg via ORAL
  Filled 2021-12-20: qty 1

## 2021-12-20 MED ORDER — ONDANSETRON HCL 4 MG/2ML IJ SOLN
INTRAMUSCULAR | Status: DC | PRN
Start: 2021-12-20 — End: 2021-12-20
  Administered 2021-12-20: 4 mg via INTRAVENOUS

## 2021-12-20 MED ORDER — OXYCODONE HCL 5 MG PO TABS: 5.0000 mg | ORAL_TABLET | ORAL | Status: DC | PRN

## 2021-12-20 SURGICAL SUPPLY — 40 items
BAG COUNTER SPONGE SURGICOUNT (BAG) ×1 IMPLANT
BAG DECANTER FOR FLEXI CONT (MISCELLANEOUS) IMPLANT
BAG ZIPLOCK 12X15 (MISCELLANEOUS) ×1 IMPLANT
BLADE SAG 18X100X1.27 (BLADE) ×2 IMPLANT
COVER PERINEAL POST (MISCELLANEOUS) ×2 IMPLANT
COVER SURGICAL LIGHT HANDLE (MISCELLANEOUS) ×2 IMPLANT
CUP ACET PINNACLE SECTR 56MM (Hips) IMPLANT
DERMABOND ADVANCED (GAUZE/BANDAGES/DRESSINGS) ×1
DERMABOND ADVANCED .7 DNX12 (GAUZE/BANDAGES/DRESSINGS) ×1 IMPLANT
DRAPE FOOT SWITCH (DRAPES) ×2 IMPLANT
DRAPE STERI IOBAN 125X83 (DRAPES) ×2 IMPLANT
DRAPE U-SHAPE 47X51 STRL (DRAPES) ×4 IMPLANT
DRESSING AQUACEL AG SP 3.5X10 (GAUZE/BANDAGES/DRESSINGS) ×1 IMPLANT
DRSG AQUACEL AG SP 3.5X10 (GAUZE/BANDAGES/DRESSINGS) ×2
DURAPREP 26ML APPLICATOR (WOUND CARE) ×2 IMPLANT
ELECT REM PT RETURN 15FT ADLT (MISCELLANEOUS) ×2 IMPLANT
ELIMINATOR HOLE APEX DEPUY (Hips) ×1 IMPLANT
GLOVE SURG ENC MOIS LTX SZ6 (GLOVE) ×2 IMPLANT
GLOVE SURG ENC MOIS LTX SZ7 (GLOVE) ×2 IMPLANT
GLOVE SURG UNDER LTX SZ6.5 (GLOVE) ×2 IMPLANT
GLOVE SURG UNDER POLY LF SZ7.5 (GLOVE) ×2 IMPLANT
GOWN STRL REUS W/TWL LRG LVL3 (GOWN DISPOSABLE) ×4 IMPLANT
HEAD CERAMIC DELTA 36 PLUS 1.5 (Hips) ×1 IMPLANT
HOLDER FOLEY CATH W/STRAP (MISCELLANEOUS) ×2 IMPLANT
KIT TURNOVER KIT A (KITS) IMPLANT
PACK ANTERIOR HIP CUSTOM (KITS) ×2 IMPLANT
PINNACLE ALTRX PLUS 4 N 36X56 (Hips) ×1 IMPLANT
PINNACLE SECTOR CUP 56MM (Hips) ×2 IMPLANT
SCREW 6.5MMX30MM (Screw) ×1 IMPLANT
SPONGE T-LAP 18X18 ~~LOC~~+RFID (SPONGE) ×5 IMPLANT
STEM FEM ACTIS HIGH SZ7 (Stem) ×1 IMPLANT
SUT MNCRL AB 4-0 PS2 18 (SUTURE) ×2 IMPLANT
SUT STRATAFIX 0 PDS 27 VIOLET (SUTURE) ×2
SUT VIC AB 1 CT1 36 (SUTURE) ×6 IMPLANT
SUT VIC AB 2-0 CT1 27 (SUTURE) ×2
SUT VIC AB 2-0 CT1 TAPERPNT 27 (SUTURE) ×2 IMPLANT
SUTURE STRATFX 0 PDS 27 VIOLET (SUTURE) ×1 IMPLANT
TRAY FOLEY MTR SLVR 16FR STAT (SET/KITS/TRAYS/PACK) ×1 IMPLANT
TUBE SUCTION HIGH CAP CLEAR NV (SUCTIONS) ×2 IMPLANT
WATER STERILE IRR 1000ML POUR (IV SOLUTION) ×3 IMPLANT

## 2021-12-20 NOTE — Plan of Care (Signed)
  Problem: Activity: Goal: Risk for activity intolerance will decrease Outcome: Progressing   Problem: Pain Managment: Goal: General experience of comfort will improve Outcome: Progressing   Problem: Safety: Goal: Ability to remain free from injury will improve Outcome: Progressing   

## 2021-12-20 NOTE — Transfer of Care (Signed)
Immediate Anesthesia Transfer of Care Note  Patient: Michael Burnett  Procedure(s) Performed: TOTAL HIP ARTHROPLASTY ANTERIOR APPROACH (Left: Hip)  Patient Location: PACU  Anesthesia Type:Spinal  Level of Consciousness: awake, drowsy and patient cooperative  Airway & Oxygen Therapy: Patient Spontanous Breathing and Patient connected to face mask oxygen  Post-op Assessment: Report given to RN and Post -op Vital signs reviewed and stable  Post vital signs: Reviewed and stable  Last Vitals:  Vitals Value Taken Time  BP 104/73 12/20/21 1151  Temp    Pulse 56 12/20/21 1152  Resp 23 12/20/21 1152  SpO2 100 % 12/20/21 1152  Vitals shown include unvalidated device data.  Last Pain:  Vitals:   12/20/21 0808  TempSrc:   PainSc: 0-No pain      Patients Stated Pain Goal: 4 (12/20/21 3646)  Complications: No notable events documented.

## 2021-12-20 NOTE — Op Note (Signed)
NAME:  Michael Burnett                ACCOUNT NO.: [id number]      MEDICAL RECORD NO.: [id number]      FACILITY:  [facility]      PHYSICIAN:  Mauri Pole  DATE OF BIRTH:  1956-02-18     DATE OF PROCEDURE:  12/20/2021                                 OPERATIVE REPORT         PREOPERATIVE DIAGNOSIS: Left  hip osteoarthritis.      POSTOPERATIVE DIAGNOSIS:  Left hip osteoarthritis.      PROCEDURE:  Left total hip replacement through an anterior approach   utilizing DePuy THR system, component size 54mm pinnacle cup, a size 36+4 neutral   Altrex liner, a size 7 Hi Actis stem with a 36+1.5 delta ceramic   ball.      SURGEON:  Pietro Cassis. Alvan Dame, M.D.      ASSISTANT: Costella Hatcher, PA-C     ANESTHESIA:  Spinal.      SPECIMENS:  None.      COMPLICATIONS:  None.      BLOOD LOSS:  600 cc     DRAINS:  None.      INDICATION OF THE PROCEDURE:  Michael Burnett is a 66 y.o. male who had   presented to office for evaluation of left hip pain.  Radiographs revealed   progressive degenerative changes with bone-on-bone   articulation of the  hip joint, including subchondral cystic changes and osteophytes.  The patient had painful limited range of   motion significantly affecting their overall quality of life and function.  The patient was failing to    respond to conservative measures including medications and/or injections and activity modification and at this point was ready   to proceed with more definitive measures.  Consent was obtained for   benefit of pain relief.  Specific risks of infection, DVT, component   failure, dislocation, neurovascular injury, and need for revision surgery were reviewed in the office as well discussion of   the anterior versus posterior approach were reviewed.     PROCEDURE IN DETAIL:  The patient was brought to operative theater.   Once adequate anesthesia, preoperative antibiotics, 2 gm of Ancef, 1 gm of Tranexamic Acid, and 10 mg of  Decadron were administered, the patient was positioned supine on the [employer] table.  Once the patient was safely positioned with adequate padding of boney prominences we predraped out the hip, and used fluoroscopy to confirm orientation of the pelvis.      The left hip was then prepped and draped from proximal iliac crest to   mid thigh with a shower curtain technique.      Time-out was performed identifying the patient, planned procedure, and the appropriate extremity.     An incision was then made 2 cm lateral to the   anterior superior iliac spine extending over the orientation of the   tensor fascia lata muscle and sharp dissection was carried down to the   fascia of the muscle.      The fascia was then incised.  The muscle belly was identified and swept   laterally and retractor placed along the superior neck.  Following   cauterization of the circumflex vessels and removing some pericapsular   fat,  a second cobra retractor was placed on the inferior neck.  A T-capsulotomy was made along the line of the   superior neck to the trochanteric fossa, then extended proximally and   distally.  Tag sutures were placed and the retractors were then placed   intracapsular.  We then identified the trochanteric fossa and   orientation of my neck cut and then made a neck osteotomy with the femur on traction.  The femoral   head was removed without difficulty or complication.  Traction was let   off and retractors were placed posterior and anterior around the   acetabulum.      The labrum and foveal tissue were debrided.  I began reaming with a 47 mm   reamer and reamed up to 55 mm reamer with good bony bed preparation and a 56 mm  cup was chosen.  The final 56 mm Pinnacle cup was then impacted under fluoroscopy to confirm the depth of penetration and orientation with respect to   Abduction and forward flexion.  A screw was placed into the ilium followed by the hole eliminator.  The final   36+4  neutral Altrex liner was impacted with good visualized rim fit.  The cup was positioned anatomically within the acetabular portion of the pelvis.      At this point, the femur was rolled to 100 degrees.  Further capsule was   released off the inferior aspect of the femoral neck.  I then   released the superior capsule proximally.  With the leg in a neutral position the hook was placed laterally   along the femur under the vastus lateralis origin and elevated manually and then held in position using the hook attachment on the bed.  The leg was then extended and adducted with the leg rolled to 100   degrees of external rotation.  Retractors were placed along the medial calcar and posteriorly over the greater trochanter.  Once the proximal femur was fully   exposed, I used a box osteotome to set orientation.  I then began   broaching with the starting chili pepper broach and passed this by hand and then broached up to 7.  With the 7 broach in place I chose a high offset neck and did several trial reductions.  The offset was appropriate, leg lengths   appeared to be equal best matched with the +1.5 head ball trial confirmed radiographically.   Given these findings, I went ahead and dislocated the hip, repositioned all   retractors and positioned the right hip in the extended and abducted position.  The final 7 Hi Actis stem was   chosen and it was impacted down to the level of neck cut.  Based on this   and the trial reductions, a final 36+1.5 delta ceramic ball was chosen and   impacted onto a clean and dry trunnion, and the hip was reduced.  The   hip had been irrigated throughout the case again at this point.  I did   reapproximate the superior capsular leaflet to the anterior leaflet   using #1 Vicryl.  The fascia of the   tensor fascia lata muscle was then reapproximated using #1 Vicryl and #0 Stratafix sutures.  The   remaining wound was closed with 2-0 Vicryl and running 4-0 Monocryl.   The  hip was cleaned, dried, and dressed sterilely using Dermabond and   Aquacel dressing.  The patient was then brought   to recovery room in stable  condition tolerating the procedure well.    Costella Hatcher, PA-C was present for the entirety of the case involved from   preoperative positioning, perioperative retractor management, general   facilitation of the case, as well as primary wound closure as assistant.            Pietro Cassis Alvan Dame, M.D.        12/20/2021 10:09 AM

## 2021-12-20 NOTE — Interval H&P Note (Signed)
History and Physical Interval Note:  12/20/2021 8:55 AM  Michael Burnett  has presented today for surgery, with the diagnosis of Left hip osteoarthritis.  The various methods of treatment have been discussed with the patient and family. After consideration of risks, benefits and other options for treatment, the patient has consented to  Procedure(s): TOTAL HIP ARTHROPLASTY ANTERIOR APPROACH (Left) as a surgical intervention.  The patient's history has been reviewed, patient examined, no change in status, stable for surgery.  I have reviewed the patient's chart and labs.  Questions were answered to the patient's satisfaction.     [name]

## 2021-12-20 NOTE — Progress Notes (Signed)
PT Cancellation Note  Patient Details Name: Michael Burnett MRN: [id number] DOB: [birth year]   Cancelled Treatment:     PT order received and eval attempted x2 but deferred initially 2* uncontrolled pain and on second attempt 2* lethargy.  Will follow in am.   Josslyn Ciolek 12/20/2021, 4:15 PM

## 2021-12-20 NOTE — Anesthesia Postprocedure Evaluation (Signed)
Anesthesia Post Note  Patient: Michael Burnett  Procedure(s) Performed: TOTAL HIP ARTHROPLASTY ANTERIOR APPROACH (Left: Hip)     Patient location during evaluation: PACU Anesthesia Type: MAC Level of consciousness: awake and alert Pain management: pain level controlled Vital Signs Assessment: post-procedure vital signs reviewed and stable Respiratory status: spontaneous breathing and respiratory function stable Cardiovascular status: blood pressure returned to baseline and stable Postop Assessment: spinal receding Anesthetic complications: no   No notable events documented.  Last Vitals:  Vitals:   12/20/21 1245 12/20/21 1300  BP: 107/82 106/78  Pulse: (!) 57 (!) 55  Resp: 11 10  Temp:  (!) 36.3 C  SpO2: 100% 100%    Last Pain:  Vitals:   12/20/21 1300  TempSrc:   PainSc: 4                  Emorie Mcfate DANIEL

## 2021-12-20 NOTE — Anesthesia Procedure Notes (Signed)
Spinal  Patient location during procedure: OR Start time: 12/20/2021 10:04 AM End time: 12/20/2021 10:07 AM Staffing Performed: resident/CRNA  Anesthesiologist: Heather Roberts, MD Resident/CRNA: Yolonda Kida, CRNA Preanesthetic Checklist Completed: patient identified, IV checked, site marked, risks and benefits discussed, surgical consent, monitors and equipment checked, pre-op evaluation and timeout performed Spinal Block Patient position: sitting Prep: DuraPrep Patient monitoring: blood pressure, continuous pulse ox and heart rate Approach: midline Location: L3-4 Injection technique: single-shot Needle Needle type: Pencan  Needle gauge: 24 G Assessment Sensory level: T6 Events: CSF return

## 2021-12-20 NOTE — Discharge Instructions (Signed)

## 2021-12-21 ENCOUNTER — Encounter (HOSPITAL_COMMUNITY): Payer: Self-pay | Admitting: Orthopedic Surgery

## 2021-12-21 DIAGNOSIS — M1612 Unilateral primary osteoarthritis, left hip: Secondary | ICD-10-CM | POA: Diagnosis not present

## 2021-12-21 LAB — BASIC METABOLIC PANEL
Anion gap: 5 (ref 5–15)
BUN: 14 mg/dL (ref 8–23)
CO2: 22 mmol/L (ref 22–32)
Calcium: 8 mg/dL — ABNORMAL LOW (ref 8.9–10.3)
Chloride: 107 mmol/L (ref 98–111)
Creatinine, Ser: 1.23 mg/dL (ref 0.61–1.24)
GFR, Estimated: >60 mL/min (ref >60)
Glucose, Bld: 126 mg/dL — ABNORMAL HIGH (ref 70–99)
Potassium: 4.1 mmol/L (ref 3.5–5.1)
Sodium: 134 mmol/L — ABNORMAL LOW (ref 135–145)

## 2021-12-21 LAB — GLUCOSE, CAPILLARY
Glucose-Capillary: 135 mg/dL — ABNORMAL HIGH (ref 70–99)
Glucose-Capillary: 115 mg/dL — ABNORMAL HIGH (ref 70–99)

## 2021-12-21 LAB — CBC
HCT: 39.3 % (ref 39.0–52.0)
Hemoglobin: 13.2 g/dL (ref 13.0–17.0)
MCH: 27.8 pg (ref 26.0–34.0)
MCHC: 33.6 g/dL (ref 30.0–36.0)
MCV: 82.7 fL (ref 80.0–100.0)
Platelets: 147 10*3/uL — ABNORMAL LOW (ref 150–400)
RBC: 4.75 MIL/uL (ref 4.22–5.81)
RDW: 14.6 % (ref 11.5–15.5)
WBC: 7.8 10*3/uL (ref 4.0–10.5)
nRBC: 0 % (ref 0.0–0.2)

## 2021-12-21 MED ORDER — OXYCODONE HCL 5 MG PO TABS: 5.0000 mg | ORAL_TABLET | Freq: Four times a day (QID) | ORAL | 0 refills | Status: AC | PRN

## 2021-12-21 MED ORDER — METHOCARBAMOL 500 MG PO TABS: 500.0000 mg | ORAL_TABLET | Freq: Four times a day (QID) | ORAL | 0 refills | Status: AC | PRN

## 2021-12-21 MED ORDER — DOCUSATE SODIUM 100 MG PO CAPS: 100.0000 mg | ORAL_CAPSULE | Freq: Two times a day (BID) | ORAL | 0 refills | Status: AC

## 2021-12-21 MED ORDER — OXYCODONE HCL 5 MG PO TABS
5.0000 mg | ORAL_TABLET | Freq: Four times a day (QID) | ORAL | Status: DC | PRN
  Administered 2021-12-21: 5 mg via ORAL
  Filled 2021-12-21: qty 1

## 2021-12-21 MED ORDER — ASPIRIN 81 MG PO CHEW: 81.0000 mg | CHEWABLE_TABLET | Freq: Two times a day (BID) | ORAL | 0 refills | Status: AC

## 2021-12-21 MED ORDER — POLYETHYLENE GLYCOL 3350 17 G PO PACK: 17.0000 g | PACK | Freq: Every day | ORAL | 0 refills | Status: AC | PRN

## 2021-12-21 NOTE — Progress Notes (Signed)
Provided discharge education/instructions, all questions and concerns addressed. Pt not ina cute distress, discharged home with belongings accompanied by wife. 

## 2021-12-21 NOTE — Plan of Care (Signed)
  Problem: Education: Goal: Knowledge of General Education information will improve Description Including pain rating scale, medication(s)/side effects and non-pharmacologic comfort measures Outcome: Progressing   Problem: Nutrition: Goal: Adequate nutrition will be maintained Outcome: Progressing   Problem: Pain Managment: Goal: General experience of comfort will improve Outcome: Progressing   

## 2021-12-21 NOTE — Plan of Care (Signed)

## 2021-12-21 NOTE — TOC Transition Note (Signed)
Transition of Care [facility]) - CM/SW Discharge Note   Patient Details  Name: Michael Burnett MRN: [id number] Date of Birth: [birth year]  [facility] of [facility] CM/SW Contact:  Lennart Pall, LCSW Phone Number: 12/21/2021, 9:35 AM   Clinical Narrative:    Met briefly with pt and confirming he has all needed DME at home.  Plan HEP.  No TOC needs.   Final next level of care: Home/Self Care Barriers to Discharge: No Barriers Identified   Patient Goals and CMS Choice Patient states their goals for this hospitalization and ongoing recovery are:: return home      Discharge Placement                       Discharge Plan and Services                DME Arranged: N/A DME Agency: NA                  Social Determinants of Health (SDOH) Interventions     Readmission Risk Interventions No flowsheet data found.

## 2021-12-21 NOTE — Evaluation (Signed)
Physical Therapy Evaluation Patient Details Name: Michael Burnett MRN: [id number] DOB: 26-Jul-1956 Today's Date: 12/21/2021  History of Present Illness  Pt s/p L THR and with hx of R hip resurfacing, CKD, and DM  Clinical Impression  Pt s/p L THR and presents with decreased L LE strength/ROM and post op pain limiting functional mobility.  This am, pt lethargic but rousable and required increased time and significant assist for performance of basic mobility tasks.  Pt should progress  to dc home with family assist.     Recommendations for follow up therapy are one component of a multi-disciplinary discharge planning process, led by the attending physician.  Recommendations may be updated based on patient status, additional functional criteria and insurance authorization.  Follow Up Recommendations Follow physician's recommendations for discharge plan and follow up therapies    Assistance Recommended at Discharge Frequent or constant Supervision/Assistance  Patient can return home with the following  A lot of help with walking and/or transfers;A lot of help with bathing/dressing/bathroom;Assistance with cooking/housework;Direct supervision/assist for medications management;Direct supervision/assist for financial management;Help with stairs or ramp for entrance    Equipment Recommendations None recommended by PT  Recommendations for Other Services       Functional Status Assessment Patient has had a recent decline in their functional status and demonstrates the ability to make significant improvements in function in a reasonable and predictable amount of time.     Precautions / Restrictions Precautions Precautions: Fall Restrictions Weight Bearing Restrictions: No LLE Weight Bearing: Weight bearing as tolerated      Mobility  Bed Mobility Overal bed mobility: Needs Assistance Bed Mobility: Supine to Sit     Supine to sit: Min assist, Mod assist     General bed mobility comments:  INcreased time with cues for sequence and use of R LE to self assist.  Physical assist to manage L LE and to control trunk    Transfers Overall transfer level: Needs assistance Equipment used: Rolling walker (2 wheels) Transfers: Sit to/from Stand Sit to Stand: From elevated surface, Min assist, Mod assist           General transfer comment: cues for LE management and use of UEs to self assist.  Physical assist to bring wt up and fwd and to balance in initial standing    Ambulation/Gait Ambulation/Gait assistance: Min assist, Mod assist Gait Distance (Feet): 20 Feet Assistive device: Rolling walker (2 wheels) Gait Pattern/deviations: Step-to pattern, Decreased step length - right, Decreased step length - left, Shuffle, Trunk flexed Gait velocity: decr     General Gait Details: cues for sequence, posture and position from RW;  Physical assist for balance/support and RW management; noted difficulty advancing L LE  Stairs            Wheelchair Mobility    Modified Rankin (Stroke Patients Only)       Balance Overall balance assessment: Needs assistance Sitting-balance support: No upper extremity supported, Feet supported Sitting balance-Leahy Scale: Fair     Standing balance support: Bilateral upper extremity supported Standing balance-Leahy Scale: Poor                               Pertinent Vitals/Pain Pain Assessment Pain Assessment: 0-10 Pain Score: 6  Pain Location: L hip Pain Descriptors / Indicators: Aching, Grimacing, Sore Pain Intervention(s): Limited activity within patient's tolerance, Monitored during session, Ice applied, Premedicated before session (Premed with tyelenol only 2* pt lethargy)  Home Living Family/patient expects to be discharged to:: Private residence Living Arrangements: Spouse/significant other Available Help at Discharge: Family Type of Home: House Home Access: Stairs to enter Entrance Stairs-Rails:  Right;Left;Can reach both Entrance Stairs-Number of Steps: 2   Home Layout: One level Home Equipment: Agricultural consultantolling Walker (2 wheels);Cane - single point      Prior Function Prior Level of Function : Independent/Modified Independent             Mobility Comments: 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       Hand Dominance        Extremity/Trunk Assessment   Upper Extremity Assessment Upper Extremity Assessment: Overall WFL for tasks assessed    Lower Extremity Assessment Lower Extremity Assessment: LLE deficits/detail LLE Deficits / Details: 2/5 strength at hip with AAROM at hip to 75 flex and 15 abd    Cervical / Trunk Assessment Cervical / Trunk Assessment: Normal  Communication   Communication: No difficulties  Cognition Arousal/Alertness: Lethargic, Suspect due to medications (lethargic but rousable) Behavior During Therapy: Flat affect Overall Cognitive Status: Within Functional Limits for tasks assessed                                          General Comments      Exercises Total Joint Exercises Ankle Circles/Pumps: AROM, Both, 15 reps, Supine Quad Sets: AROM, Both, 10 reps, Supine Heel Slides: AAROM, Left, 20 reps, Supine Hip ABduction/ADduction: AAROM, Left, 15 reps, Supine   Assessment/Plan    PT Assessment Patient needs continued PT services  PT Problem List Decreased strength;Decreased range of motion;Decreased activity tolerance;Decreased balance;Decreased mobility;Decreased knowledge of use of DME;Decreased safety awareness;Pain       PT Treatment Interventions DME instruction;Gait training;Stair training;Functional mobility training;Therapeutic activities;Therapeutic exercise;Patient/family education    PT Goals (Current goals can be found in the Care Plan section)  Acute Rehab PT Goals Patient Stated Goal: Regain IND and get back to work PT Goal Formulation: With patient Time For Goal Achievement: 12/28/21 Potential to Achieve  Goals: Good    Frequency 7X/week     Co-evaluation               AM-PAC PT "6 Clicks" Mobility  Outcome Measure Help needed turning from your back to your side while in a flat bed without using bedrails?: A Lot Help needed moving from lying on your back to sitting on the side of a flat bed without using bedrails?: A Lot Help needed moving to and from a bed to a chair (including a wheelchair)?: A Lot Help needed standing up from a chair using your arms (e.g., wheelchair or bedside chair)?: A Lot Help needed to walk in hospital room?: A Lot Help needed climbing 3-5 steps with a railing? : Total 6 Click Score: 11    End of Session Equipment Utilized During Treatment: Gait belt Activity Tolerance: Patient limited by lethargy;Patient limited by pain;Patient limited by fatigue Patient left: in chair;with call bell/phone within reach;with chair alarm set;with family/visitor present Nurse Communication: Mobility status PT Visit Diagnosis: Unsteadiness on feet (R26.81);Difficulty in walking, not elsewhere classified (R26.2);Pain Pain - Right/Left: Left Pain - part of body: Hip    Time: 0813-0905 PT Time Calculation (min) (ACUTE ONLY): 52 min   Charges:   PT Evaluation $PT Eval Low Complexity: 1 Low PT Treatments $Gait Training: 8-22 mins $Therapeutic Exercise: 8-22 mins  Mauro Kaufmann PT Acute Rehabilitation Services Pager [phone] Office [phone]   [facility] 12/21/2021, 11:53 AM

## 2021-12-21 NOTE — Progress Notes (Addendum)
Physical Therapy Treatment Patient Details Name: Michael Burnett MRN: [id number] DOB: 1956/10/24 Today's Date: 12/21/2021   History of Present Illness Pt s/p L THR and with hx of R hip resurfacing, CKD, and DM    PT Comments    Pt in good spirits, much more alert, and with marked improvement in all areas of mobility.  Pt with increased activity tolerance, ambulated increased distance, reviewed bed mobility, reviewed car transfers, reviewed lower body dressing, and reviewed HEP with written instruction provided.  Pt and spouse eager for dc home this date.   Recommendations for follow up therapy are one component of a multi-disciplinary discharge planning process, led by the attending physician.  Recommendations may be updated based on patient status, additional functional criteria and insurance authorization.  Follow Up Recommendations  Follow physician's recommendations for discharge plan and follow up therapies     Assistance Recommended at Discharge Frequent or constant Supervision/Assistance  Patient can return home with the following Assistance with cooking/housework;Direct supervision/assist for medications management;Direct supervision/assist for financial management;Help with stairs or ramp for entrance;A little help with walking and/or transfers;A little help with bathing/dressing/bathroom   Equipment Recommendations  None recommended by PT    Recommendations for Other Services       Precautions / Restrictions Precautions Precautions: Fall Restrictions Weight Bearing Restrictions: No LLE Weight Bearing: Weight bearing as tolerated     Mobility  Bed Mobility Overal bed mobility: Needs Assistance Bed Mobility: Sit to Supine     Supine to sit: Min assist, Mod assist Sit to supine: Min assist   General bed mobility comments: cues for sequence and min assist to manage L LE    Transfers Overall transfer level: Needs assistance Equipment used: Rolling walker (2  wheels) Transfers: Sit to/from Stand Sit to Stand: Min guard, Supervision           General transfer comment: cues for LE management and use of UEs to self assist    Ambulation/Gait Ambulation/Gait assistance: Min assist, Min guard, Supervision Gait Distance (Feet): 120 Feet Assistive device: Rolling walker (2 wheels) Gait Pattern/deviations: Step-to pattern, Decreased step length - right, Decreased step length - left, Shuffle, Trunk flexed Gait velocity: decr     General Gait Details: cues for sequence, posture and position from RW;   Stairs Stairs: Yes Stairs assistance: Min assist Stair Management: Two rails, Step to pattern, Forwards Number of Stairs: 5 General stair comments: cues for sequence   Wheelchair Mobility    Modified Rankin (Stroke Patients Only)       Balance Overall balance assessment: Needs assistance Sitting-balance support: No upper extremity supported, Feet supported Sitting balance-Leahy Scale: Good     Standing balance support: No upper extremity supported Standing balance-Leahy Scale: Fair                              Cognition Arousal/Alertness: Awake/alert Behavior During Therapy: WFL for tasks assessed/performed Overall Cognitive Status: Within Functional Limits for tasks assessed                                          Exercises Total Joint Exercises Ankle Circles/Pumps: AROM, Both, 15 reps, Supine Quad Sets: AROM, Both, Supine, 5 reps Heel Slides: AAROM, Left, Supine, 5 reps Hip ABduction/ADduction: AAROM, Left, Supine, 5 reps    General Comments  Pertinent Vitals/Pain Pain Assessment Pain Assessment: 0-10 Pain Score: 3  Pain Location: L hip Pain Descriptors / Indicators: Aching, Grimacing, Sore Pain Intervention(s): Limited activity within patient's tolerance, Monitored during session, Premedicated before session    Home Living Family/patient expects to be discharged to::  Private residence Living Arrangements: Spouse/significant other Available Help at Discharge: Family Type of Home: House Home Access: Stairs to enter Entrance Stairs-Rails: Right;Left;Can reach both Entrance Stairs-Number of Steps: 2   Home Layout: One level Home Equipment: Agricultural consultant (2 wheels);Cane - single point      Prior Function            PT Goals (current goals can now be found in the care plan section) Acute Rehab PT Goals Patient Stated Goal: Regain IND and get back to work PT Goal Formulation: With patient Time For Goal Achievement: 12/28/21 Potential to Achieve Goals: Good Progress towards PT goals: Progressing toward goals    Frequency    7X/week      PT Plan Current plan remains appropriate    Co-evaluation              AM-PAC PT "6 Clicks" Mobility   Outcome Measure  Help needed turning from your back to your side while in a flat bed without using bedrails?: A Little Help needed moving from lying on your back to sitting on the side of a flat bed without using bedrails?: A Little Help needed moving to and from a bed to a chair (including a wheelchair)?: A Little Help needed standing up from a chair using your arms (e.g., wheelchair or bedside chair)?: A Little Help needed to walk in hospital room?: A Little Help needed climbing 3-5 steps with a railing? : A Little 6 Click Score: 18    End of Session Equipment Utilized During Treatment: Gait belt Activity Tolerance: Patient tolerated treatment well Patient left: in bed;with call bell/phone within reach;with family/visitor present Nurse Communication: Mobility status PT Visit Diagnosis: Unsteadiness on feet (R26.81);Difficulty in walking, not elsewhere classified (R26.2);Pain Pain - Right/Left: Left Pain - part of body: Hip     Time: 0258-5277 PT Time Calculation (min) (ACUTE ONLY): 40 min  Charges:  $Gait Training: 8-22 mins $Therapeutic Exercise: 8-22 mins $Therapeutic Activity:  8-22 mins                     Mauro Kaufmann PT Acute Rehabilitation Services Pager [phone] Office [phone]    Michael Burnett 12/21/2021, 2:35 PM

## 2021-12-21 NOTE — Progress Notes (Signed)
° °  Subjective: 1 Day Post-Op Procedure(s) (LRB): TOTAL HIP ARTHROPLASTY ANTERIOR APPROACH (Left) Patient reports pain as mild.   Patient seen in rounds for Dr. Alvan Dame. Patient is resting in bed on exam this morning with his wife at the bedside. His wife states he has been sleeping all evening/night. He is able to answer questions appropriately, but does appear drowsy.  We will start therapy today.   Objective: Vital signs in last 24 hours: Temp:  [97.3 F (36.3 C)-100.2 F (37.9 C)] 100.2 F (37.9 C) (01/20 0443) Pulse Rate:  [55-80] 78 (01/20 0443) Resp:  [10-18] 17 (01/20 0443) BP: (99-131)/(73-87) 125/75 (01/20 0443) SpO2:  [97 %-100 %] 100 % (01/20 0443) Weight:  [89.4 kg] 89.4 kg (01/19 0808)  Intake/Output from previous day:  Intake/Output Summary (Last 24 hours) at 12/21/2021 0745 Last data filed at 12/21/2021 0600 Gross per 24 hour  Intake 2500 ml  Output 2300 ml  Net 200 ml     Intake/Output this shift: No intake/output data recorded.  Labs: Recent Labs    12/21/21 0241  HGB 13.2   Recent Labs    12/21/21 0241  WBC 7.8  RBC 4.75  HCT 39.3  PLT 147*   Recent Labs    12/21/21 0241  NA 134*  K 4.1  CL 107  CO2 22  BUN 14  CREATININE 1.23  GLUCOSE 126*  CALCIUM 8.0*   No results for input(s): LABPT, INR in the last 72 hours.  Exam: General - Patient is Alert and Oriented Extremity - Neurologically intact Sensation intact distally Intact pulses distally Dorsiflexion/Plantar flexion intact Dressing - dressing C/D/I Motor Function - intact, moving foot and toes well on exam.   Past Medical History:  Diagnosis Date   Chronic kidney disease    Diabetes mellitus without complication (HCC)    Hypertension     Assessment/Plan: 1 Day Post-Op Procedure(s) (LRB): TOTAL HIP ARTHROPLASTY ANTERIOR APPROACH (Left) Principal Problem:   S/P total left hip arthroplasty  Estimated body mass index is 28.27 kg/m as calculated from the following:    Height as of this encounter: 5\' 10"  (1.778 m).   Weight as of this encounter: 89.4 kg. Advance diet Up with therapy D/C IV fluids  DVT Prophylaxis - Aspirin Weight bearing as tolerated.  Hgb stable at 13.2 this AM.   Patient was not able to work with therapy yesterday afternoon due to lethargy. He does appear sleepy on exam this morning, but is alert and able to answer questions appropriately. I discussed with his overnight RN, who states she did not give any pain medication on her shift, but he did sleep all night. We will minimize sedating medication and see how he does today.  Plan is to go Home after hospital stay. Plan to get up with PT today. If he is more alert, pain is well controlled, and he meets goals with PT we will plan for d/c home today. If not, tomorrow.  Griffith Citron, PA-C Orthopedic Surgery [phone] 12/21/2021, 7:45 AM

## 2021-12-28 NOTE — Discharge Summary (Signed)
Patient ID: Michael Burnett MRN: [id number] DOB/AGE: 1956/06/13 66 y.o.  Admit date: 12/20/2021 Discharge date: 12/21/2021  Admission Diagnoses:  Left hip osteoarthritis  Discharge Diagnoses:  Principal Problem:   S/P total left hip arthroplasty   Past Medical History:  Diagnosis Date   Chronic kidney disease    Diabetes mellitus without complication (HCC)    Hypertension     Surgeries: Procedure(s): TOTAL HIP ARTHROPLASTY ANTERIOR APPROACH on 12/20/2021   Consultants:   Discharged Condition: Improved  Hospital Course: Michael RoysGeorge Burnett is an 66 y.o. male who was admitted 12/20/2021 for operative treatment ofS/P total left hip arthroplasty. Patient has severe unremitting pain that affects sleep, daily activities, and work/hobbies. After pre-op clearance the patient was taken to the operating room on 12/20/2021 and underwent  Procedure(s): TOTAL HIP ARTHROPLASTY ANTERIOR APPROACH.    Patient was given perioperative antibiotics:  Anti-infectives (From admission, onward)    Start     Dose/Rate Route Frequency Ordered Stop   12/20/21 1600  ceFAZolin (ANCEF) IVPB 2g/100 mL premix        2 g 200 mL/hr over 30 Minutes Intravenous Every 6 hours 12/20/21 1357 12/20/21 2252   12/20/21 0800  ceFAZolin (ANCEF) IVPB 2g/100 mL premix        2 g 200 mL/hr over 30 Minutes Intravenous On call to O.R. 12/20/21 0753 12/20/21 1041        Patient was given sequential compression devices, early ambulation, and chemoprophylaxis to prevent DVT. Patient worked with PT and was meeting their goals regarding safe ambulation and transfers.  Patient benefited maximally from hospital stay and there were no complications.    Recent vital signs: No data found.   Recent laboratory studies: No results for input(s): WBC, HGB, HCT, PLT, NA, K, CL, CO2, BUN, CREATININE, GLUCOSE, INR, CALCIUM in the last 72 hours.  Invalid input(s): PT, 2   Discharge Medications:   Allergies as of 12/21/2021   No Known  Allergies      Medication List     STOP taking these medications    aspirin EC 81 MG tablet Replaced by: aspirin 81 MG chewable tablet   Omega-3 Krill Oil 500 MG Caps       TAKE these medications    amLODipine 10 MG tablet Commonly known as: NORVASC Take 10 mg by mouth daily.   aspirin 81 MG chewable tablet Chew 1 tablet (81 mg total) by mouth 2 (two) times daily for 28 days. Replaces: aspirin EC 81 MG tablet   atorvastatin 10 MG tablet Commonly known as: LIPITOR Take 10 mg by mouth daily.   docusate sodium 100 MG capsule Commonly known as: COLACE Take 1 capsule (100 mg total) by mouth 2 (two) times daily.   losartan 100 MG tablet Commonly known as: COZAAR Take 100 mg by mouth daily.   metFORMIN 500 MG 24 hr tablet Commonly known as: GLUCOPHAGE-XR Take 500 mg by mouth daily.   methocarbamol 500 MG tablet Commonly known as: ROBAXIN Take 1 tablet (500 mg total) by mouth every 6 (six) hours as needed for muscle spasms. Notes to patient: Last dose given 01/19 12:21pm   oxyCODONE 5 MG immediate release tablet Commonly known as: Oxy IR/ROXICODONE Take 1 tablet (5 mg total) by mouth every 6 (six) hours as needed for severe pain.   polyethylene glycol 17 g packet Commonly known as: MIRALAX / GLYCOLAX Take 17 g by mouth daily as needed for mild constipation.   Viagra 100 MG tablet Generic drug: sildenafil Take 100  mg by mouth as needed for erectile dysfunction. Notes to patient: Resume home regimen               Discharge Care Instructions  (From admission, onward)           Start     Ordered   12/21/21 0000  Change dressing       Comments: Maintain surgical dressing until follow up in the clinic. If the edges start to pull up, may reinforce with tape. If the dressing is no longer working, may remove and cover with gauze and tape, but must keep the area dry and clean.  Call with any questions or concerns.   12/21/21 0800             Diagnostic Studies: DG Pelvis Portable  Result Date: 12/20/2021 CLINICAL DATA:  Total left hip arthroplasty. EXAM: PORTABLE PELVIS 1-2 VIEWS; DG C-ARM 1-60 MIN-NO REPORT COMPARISON:  None. FINDINGS: The left hip arthroplasty components are well seated. No complicating features are identified. Remote right hip arthroplasty and evidence of prior femur fracture fixation. IMPRESSION: Well seated components of a total left hip arthroplasty. Electronically Signed   By: Rudie Meyer M.D.   On: 12/20/2021 14:25   DG C-Arm 1-60 Min-No Report  Result Date: 12/20/2021 CLINICAL DATA:  Total left hip arthroplasty. EXAM: PORTABLE PELVIS 1-2 VIEWS; DG C-ARM 1-60 MIN-NO REPORT COMPARISON:  None. FINDINGS: The left hip arthroplasty components are well seated. No complicating features are identified. Remote right hip arthroplasty and evidence of prior femur fracture fixation. IMPRESSION: Well seated components of a total left hip arthroplasty. Electronically Signed   By: Rudie Meyer M.D.   On: 12/20/2021 14:25   DG HIP OPERATIVE UNILAT W OR W/O PELVIS LEFT  Result Date: 12/20/2021 CLINICAL DATA:  Status post left hip arthroplasty EXAM: OPERATIVE left HIP (WITH PELVIS IF PERFORMED) AP VIEWS TECHNIQUE: Fluoroscopic spot image(s) were submitted for interpretation post-operatively. COMPARISON:  None. FINDINGS: Fluoroscopic images show evidence of recent left hip arthroplasty. There is evidence of previous right hip arthroplasty. Fluoroscopic time was 11 seconds. Radiation dose is 2.17 mGy. IMPRESSION: Fluoroscopic assistance was provided for left hip arthroplasty. Electronically Signed   By: Ernie Avena M.D.   On: 12/20/2021 12:23    Disposition: Discharge disposition: 01-Home or Self Care       Discharge Instructions     Call MD / Call 911   Complete by: As directed    If you experience chest pain or shortness of breath, CALL 911 and be transported to the hospital emergency room.  If you develope  a fever above 101 F, pus (white drainage) or increased drainage or redness at the wound, or calf pain, call your surgeon's office.   Change dressing   Complete by: As directed    Maintain surgical dressing until follow up in the clinic. If the edges start to pull up, may reinforce with tape. If the dressing is no longer working, may remove and cover with gauze and tape, but must keep the area dry and clean.  Call with any questions or concerns.   Constipation Prevention   Complete by: As directed    Drink plenty of fluids.  Prune juice may be helpful.  You may use a stool softener, such as Colace (over the counter) 100 mg twice a day.  Use MiraLax (over the counter) for constipation as needed.   Diet - low sodium heart healthy   Complete by: As directed    Increase  activity slowly as tolerated   Complete by: As directed    Weight bearing as tolerated with assist device (walker, cane, etc) as directed, use it as long as suggested by your surgeon or therapist, typically at least 4-6 weeks.   Post-operative opioid taper instructions:   Complete by: As directed    POST-OPERATIVE OPIOID TAPER INSTRUCTIONS: It is important to wean off of your opioid medication as soon as possible. If you do not need pain medication after your surgery it is ok to stop day one. Opioids include: Codeine, Hydrocodone(Norco, Vicodin), Oxycodone(Percocet, oxycontin) and hydromorphone amongst others.  Long term and even short term use of opiods can cause: Increased pain response Dependence Constipation Depression Respiratory depression And more.  Withdrawal symptoms can include Flu like symptoms Nausea, vomiting And more Techniques to manage these symptoms Hydrate well Eat regular healthy meals Stay active Use relaxation techniques(deep breathing, meditating, yoga) Do Not substitute Alcohol to help with tapering If you have been on opioids for less than two weeks and do not have pain than it is ok to stop all  together.  Plan to wean off of opioids This plan should start within one week post op of your joint replacement. Maintain the same interval or time between taking each dose and first decrease the dose.  Cut the total daily intake of opioids by one tablet each day Next start to increase the time between doses. The last dose that should be eliminated is the evening dose.      TED hose   Complete by: As directed    Use stockings (TED hose) for 2 weeks on both leg(s).  You may remove them at night for sleeping.        Follow-up Information     Durene Romans, MD. Schedule an appointment as soon as possible for a visit in 2 week(s).   Specialty: Orthopedic Surgery Contact information: [address] [place] [place] [address] [phone]                  Signed: Cassandria Anger 12/28/2021, 8:34 AM

## 2023-07-30 IMAGING — RF DG HIP (WITH PELVIS) OPERATIVE*L*
1 series · 2 of 2 positions shown · non-contrast
Comparison: None.

CLINICAL DATA: Status post left hip arthroplasty

EXAM:
OPERATIVE left HIP (WITH PELVIS IF PERFORMED) AP VIEWS
TECHNIQUE: Fluoroscopic spot image(s) were submitted for interpretation
post-operatively.

[Series 1: unknown protocol · 0.20mm/px · 2 of 2 slices shown]
[im 1/2]
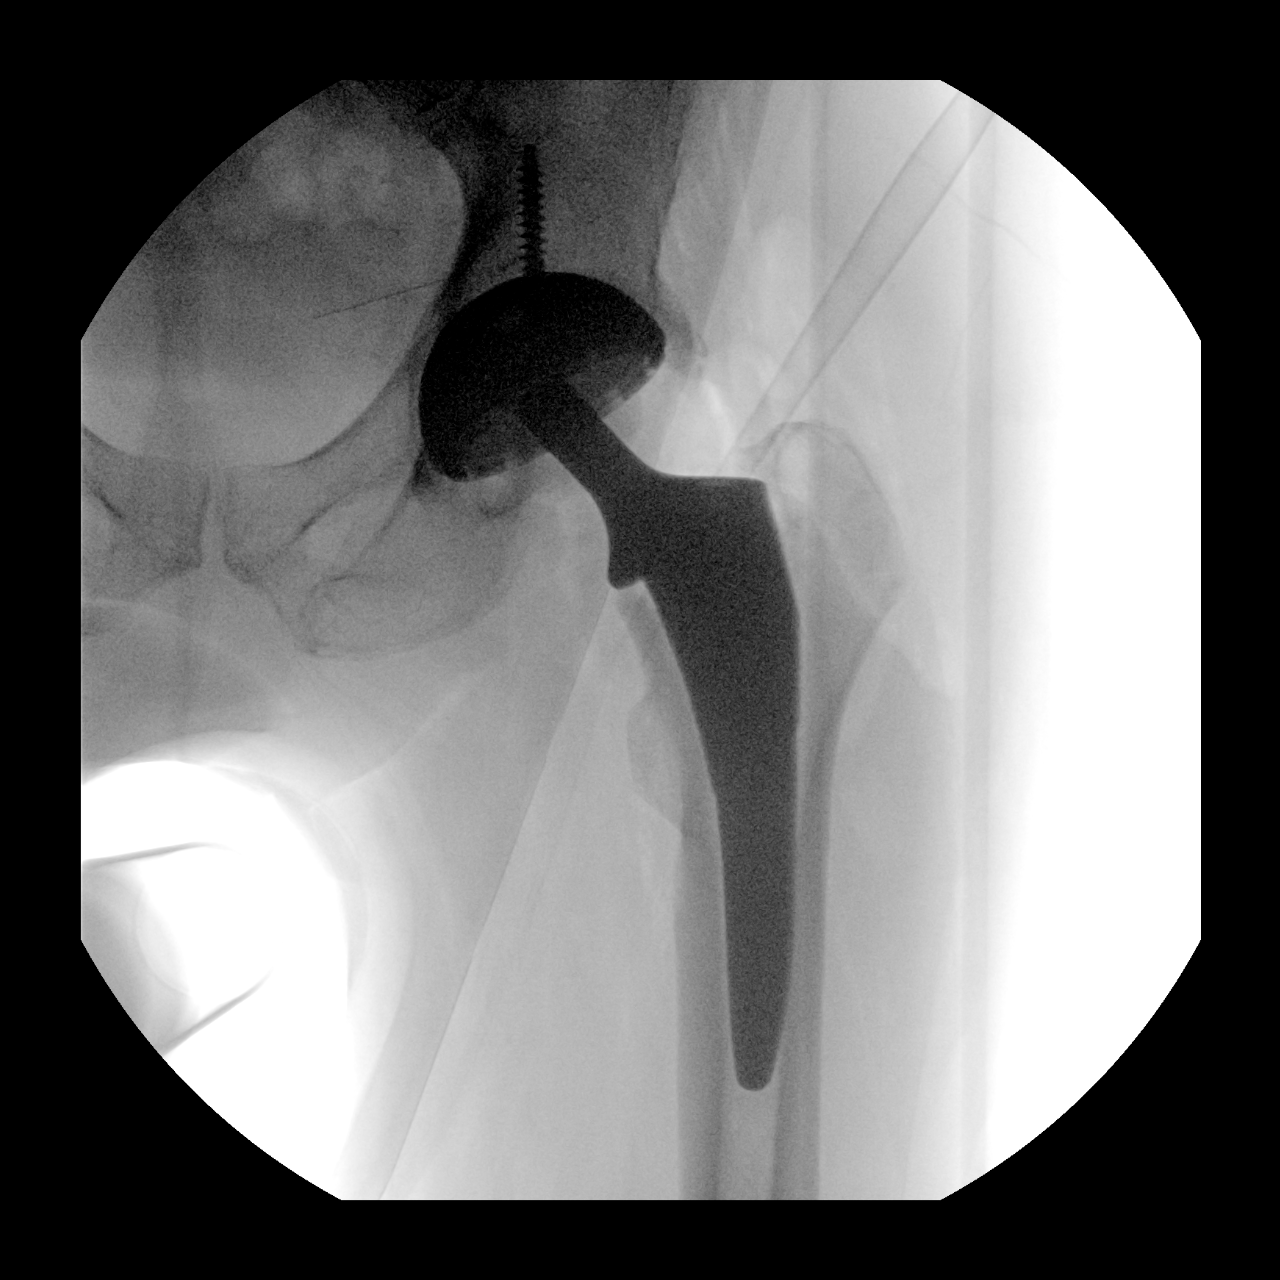
[im 2/2]
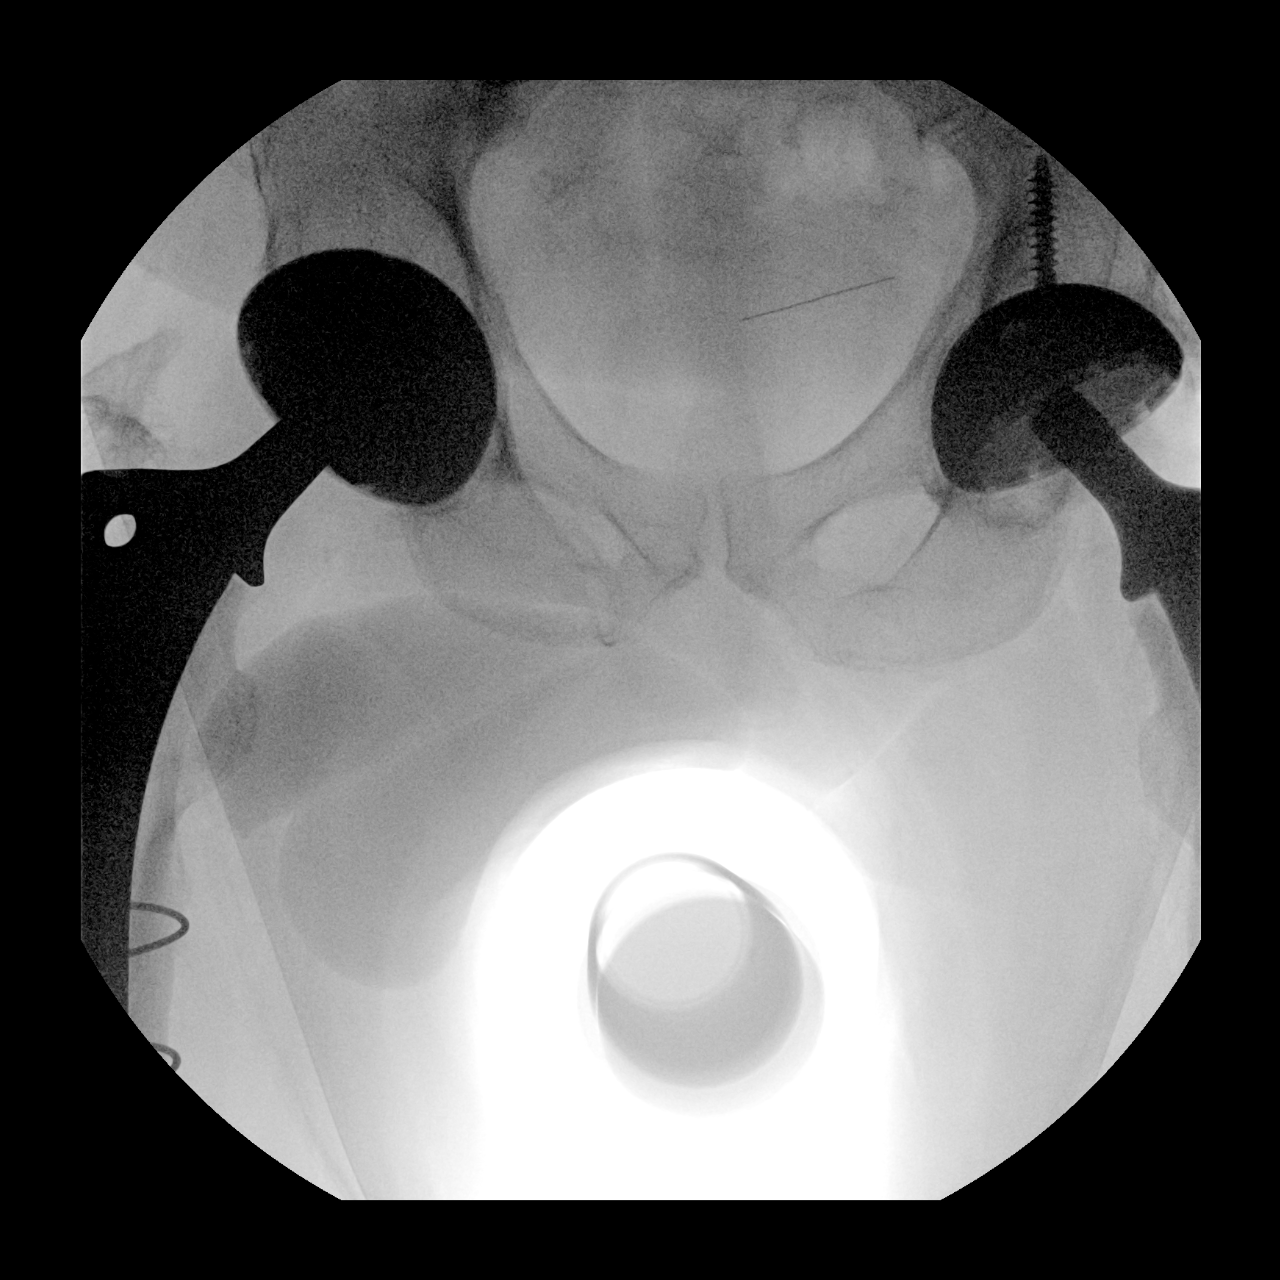

[2 of 2 positions shown; findings below may reference images not displayed]

FINDINGS: Fluoroscopic images show evidence of recent left hip arthroplasty.
There is evidence of previous right hip arthroplasty. Fluoroscopic
time was 11 seconds. Radiation dose is 2.17 mGy.
IMPRESSION: Fluoroscopic assistance was provided for left hip arthroplasty.
